# Patient Record
Sex: Female | Born: 1955 | Race: White | Hispanic: No | Marital: Single | Smoking: Former smoker
Health system: Southern US, Community
[De-identification: ages and names within clinical notes are randomized; demographics above are authoritative.]

## PROBLEM LIST (undated history)

## (undated) DIAGNOSIS — N6091 Unspecified benign mammary dysplasia of right breast: Secondary | ICD-10-CM

## (undated) DIAGNOSIS — IMO0002 Reserved for concepts with insufficient information to code with codable children: Secondary | ICD-10-CM

## (undated) DIAGNOSIS — E559 Vitamin D deficiency, unspecified: Secondary | ICD-10-CM

## (undated) DIAGNOSIS — N189 Chronic kidney disease, unspecified: Secondary | ICD-10-CM

## (undated) DIAGNOSIS — C801 Malignant (primary) neoplasm, unspecified: Secondary | ICD-10-CM

## (undated) DIAGNOSIS — E039 Hypothyroidism, unspecified: Secondary | ICD-10-CM

## (undated) DIAGNOSIS — H9190 Unspecified hearing loss, unspecified ear: Secondary | ICD-10-CM

## (undated) DIAGNOSIS — E079 Disorder of thyroid, unspecified: Secondary | ICD-10-CM

## (undated) DIAGNOSIS — K219 Gastro-esophageal reflux disease without esophagitis: Secondary | ICD-10-CM

## (undated) DIAGNOSIS — K2270 Barrett's esophagus without dysplasia: Secondary | ICD-10-CM

## (undated) HISTORY — DX: Barrett's esophagus without dysplasia: K22.70

## (undated) HISTORY — DX: Unspecified hearing loss, unspecified ear: H91.90

## (undated) HISTORY — DX: Chronic kidney disease, unspecified: N18.9

## (undated) HISTORY — PX: CERVICAL FUSION: SHX112

## (undated) HISTORY — DX: Disorder of thyroid, unspecified: E07.9

## (undated) HISTORY — DX: Unspecified benign mammary dysplasia of right breast: N60.91

## (undated) HISTORY — DX: Vitamin D deficiency, unspecified: E55.9

## (undated) HISTORY — DX: Reserved for concepts with insufficient information to code with codable children: IMO0002

---

## 1984-05-14 HISTORY — PX: TONSILLECTOMY: SUR1361

## 2000-07-19 ENCOUNTER — Ambulatory Visit (HOSPITAL_COMMUNITY): Admission: RE | Admit: 2000-07-19 | Discharge: 2000-07-19 | Payer: Self-pay | Admitting: Neurosurgery

## 2000-07-19 ENCOUNTER — Encounter: Payer: Self-pay | Admitting: Neurosurgery

## 2004-06-06 ENCOUNTER — Ambulatory Visit: Payer: Self-pay | Admitting: Obstetrics and Gynecology

## 2005-01-02 ENCOUNTER — Ambulatory Visit: Payer: Self-pay | Admitting: Unknown Physician Specialty

## 2005-05-14 DIAGNOSIS — H9190 Unspecified hearing loss, unspecified ear: Secondary | ICD-10-CM

## 2005-05-14 HISTORY — DX: Unspecified hearing loss, unspecified ear: H91.90

## 2005-07-24 ENCOUNTER — Ambulatory Visit: Payer: Self-pay | Admitting: Unknown Physician Specialty

## 2005-08-01 ENCOUNTER — Ambulatory Visit: Payer: Self-pay | Admitting: Obstetrics and Gynecology

## 2006-08-15 ENCOUNTER — Ambulatory Visit: Payer: Self-pay | Admitting: Obstetrics and Gynecology

## 2006-11-20 ENCOUNTER — Ambulatory Visit: Payer: Self-pay | Admitting: Unknown Physician Specialty

## 2007-08-29 ENCOUNTER — Ambulatory Visit: Payer: Self-pay | Admitting: Obstetrics and Gynecology

## 2009-02-03 ENCOUNTER — Ambulatory Visit: Payer: Self-pay | Admitting: Unknown Physician Specialty

## 2009-04-04 ENCOUNTER — Ambulatory Visit: Payer: Self-pay | Admitting: Internal Medicine

## 2009-06-28 LAB — HEPATIC FUNCTION PANEL
Alkaline Phosphatase: 91 U/L (ref 25–125)
Bilirubin, Total: 0.4 mg/dL

## 2009-06-28 LAB — BASIC METABOLIC PANEL
BUN: 19 mg/dL (ref 4–21)
Sodium: 141 mmol/L (ref 137–147)

## 2009-07-07 ENCOUNTER — Ambulatory Visit: Payer: Self-pay | Admitting: Internal Medicine

## 2010-01-04 LAB — BASIC METABOLIC PANEL: Potassium: 4.2 mmol/L (ref 3.4–5.3)

## 2010-01-04 LAB — HEPATIC FUNCTION PANEL
ALT: 35 U/L (ref 7–35)
AST: 22 U/L (ref 13–35)

## 2010-02-24 ENCOUNTER — Ambulatory Visit: Payer: Self-pay | Admitting: Unknown Physician Specialty

## 2010-07-07 LAB — BASIC METABOLIC PANEL
Glucose: 95 mg/dL
Sodium: 138 mmol/L (ref 137–147)

## 2010-07-07 LAB — HEPATIC FUNCTION PANEL
ALT: 29 U/L (ref 7–35)
AST: 23 U/L (ref 13–35)

## 2010-07-07 LAB — TSH: TSH: 1.64 u[IU]/mL (ref 0.41–5.90)

## 2011-04-11 ENCOUNTER — Encounter: Payer: Self-pay | Admitting: Internal Medicine

## 2011-04-11 ENCOUNTER — Ambulatory Visit (INDEPENDENT_AMBULATORY_CARE_PROVIDER_SITE_OTHER): Payer: Self-pay | Admitting: Internal Medicine

## 2011-04-11 ENCOUNTER — Ambulatory Visit (INDEPENDENT_AMBULATORY_CARE_PROVIDER_SITE_OTHER): Payer: PRIVATE HEALTH INSURANCE | Admitting: Internal Medicine

## 2011-04-11 VITALS — BP 112/70 | HR 91 | Temp 98.2°F | Wt 201.0 lb

## 2011-04-11 DIAGNOSIS — J40 Bronchitis, not specified as acute or chronic: Secondary | ICD-10-CM

## 2011-04-11 DIAGNOSIS — J329 Chronic sinusitis, unspecified: Secondary | ICD-10-CM

## 2011-04-11 DIAGNOSIS — Z0000 Encounter for general adult medical examination without abnormal findings: Secondary | ICD-10-CM

## 2011-04-11 MED ORDER — AZITHROMYCIN 250 MG PO TABS: ORAL_TABLET | ORAL | Status: AC

## 2011-04-11 MED ORDER — PREDNISONE (PAK) 10 MG PO TABS: ORAL_TABLET | ORAL | Status: AC

## 2011-04-11 MED ORDER — ALBUTEROL SULFATE HFA 108 (90 BASE) MCG/ACT IN AERS: 2.0000 | INHALATION_SPRAY | Freq: Four times a day (QID) | RESPIRATORY_TRACT | Status: DC | PRN

## 2011-04-11 MED ORDER — GUAIFENESIN-CODEINE 100-10 MG/5ML PO SYRP: 5.0000 mL | ORAL_SOLUTION | Freq: Two times a day (BID) | ORAL | Status: DC | PRN

## 2011-04-11 NOTE — Progress Notes (Signed)
Subjective:    Patient ID: Elizabeth Rice, female    DOB: 11/17/1955, 55 y.o.   MRN: [id number]  HPI 55 year old female with a history of hypothyroidism presents for an acute visit complaining of four-day history of productive cough, shortness of breath, nasal congestion, bilateral ear pain, and general malaise. She notes that her daughter was recently sick with similar symptoms. She notes fever and chills over the last 4 days. She has been taking over-the-counter cough and cold medications including Alka-Seltzer and Mucinex with no improvement.  Outpatient Encounter Prescriptions as of 04/11/2011  Medication Sig Dispense Refill  . albuterol (VENTOLIN HFA) 108 (90 BASE) MCG/ACT inhaler Inhale 2 puffs into the lungs every 6 (six) hours as needed.  18 g  6  . Cholecalciferol (VITAMIN D) 2000 UNITS CAPS Take 1 capsule by mouth daily.        . Fluticasone-Salmeterol (ADVAIR DISKUS) 250-50 MCG/DOSE AEPB Inhale 1 puff into the lungs every 12 (twelve) hours.        Marland Kitchen levothyroxine (SYNTHROID, LEVOTHROID) 50 MCG tablet Take 50 mcg by mouth daily.        . naproxen sodium (ANAPROX) 220 MG tablet Take 220 mg by mouth as needed.        Marland Kitchen omeprazole (PRILOSEC) 20 MG capsule Take 20 mg by mouth as directed. One in the AM and Two in the PM         Review of Systems  Constitutional: Negative for fever, chills and unexpected weight change.  HENT: Positive for ear pain, congestion, rhinorrhea, postnasal drip and sinus pressure. Negative for hearing loss, nosebleeds, sore throat, facial swelling, sneezing, mouth sores, trouble swallowing, neck pain, neck stiffness, voice change, tinnitus and ear discharge.   Eyes: Negative for pain, discharge, redness and visual disturbance.  Respiratory: Positive for cough, chest tightness, shortness of breath and wheezing. Negative for stridor.   Cardiovascular: Negative for chest pain, palpitations and leg swelling.  Musculoskeletal: Negative for myalgias and arthralgias.    Skin: Negative for color change and rash.  Neurological: Negative for dizziness, weakness, light-headedness and headaches.  Hematological: Negative for adenopathy.   BP 112/70  Pulse 91  Temp(Src) 98.2 F (36.8 C) (Oral)  Wt 201 lb (91.173 kg)  SpO2 97%     Objective:   Physical Exam  Constitutional: She is oriented to person, place, and time. She appears well-developed and well-nourished. No distress.  HENT:  Head: Normocephalic and atraumatic.  Right Ear: External ear normal. Tympanic membrane is erythematous. A middle ear effusion is present.  Left Ear: External ear normal. Tympanic membrane is erythematous. A middle ear effusion is present.  Nose: Nose normal.  Mouth/Throat: Posterior oropharyngeal erythema present. No oropharyngeal exudate.  Eyes: Conjunctivae are normal. Pupils are equal, round, and reactive to light. Right eye exhibits no discharge. Left eye exhibits no discharge. No scleral icterus.  Neck: Normal range of motion. Neck supple. No tracheal deviation present. No thyromegaly present.  Cardiovascular: Normal rate, regular rhythm, normal heart sounds and intact distal pulses.  Exam reveals no gallop and no friction rub.   No murmur heard. Pulmonary/Chest: Effort normal. Not tachypneic. No respiratory distress. She has wheezes. She has rhonchi. She has no rales. She exhibits no tenderness.  Musculoskeletal: Normal range of motion. She exhibits no edema and no tenderness.  Lymphadenopathy:    She has no cervical adenopathy.  Neurological: She is alert and oriented to person, place, and time. No cranial nerve deficit. She exhibits normal muscle tone. Coordination normal.  Skin: Skin is warm and dry. No rash noted. She is not diaphoretic. No erythema. No pallor.  Psychiatric: She has a normal mood and affect. Her behavior is normal. Judgment and thought content normal.          Assessment & Plan:  1. Bronchitis - symptoms and exam are consistent with  bronchitis. Will treat with azithromycin and prednisone. She will use codeine as needed for cough, and albuterol for dyspnea. She will call if symptoms are not improving over the next 48 hours.

## 2011-04-12 NOTE — Progress Notes (Signed)
  Subjective:    Patient ID: Elizabeth Rice, female    DOB: Sep 24, 1955, 55 y.o.   MRN: [id number]  HPI Entered in error, wrong pt   Review of Systems     Objective:   Physical Exam        Assessment & Plan:

## 2011-07-02 ENCOUNTER — Other Ambulatory Visit: Payer: Self-pay | Admitting: *Deleted

## 2011-07-02 MED ORDER — FLUTICASONE-SALMETEROL 250-50 MCG/DOSE IN AEPB: 1.0000 | INHALATION_SPRAY | Freq: Two times a day (BID) | RESPIRATORY_TRACT | Status: DC

## 2011-07-04 ENCOUNTER — Other Ambulatory Visit (INDEPENDENT_AMBULATORY_CARE_PROVIDER_SITE_OTHER): Payer: PRIVATE HEALTH INSURANCE | Admitting: *Deleted

## 2011-07-04 DIAGNOSIS — Z0000 Encounter for general adult medical examination without abnormal findings: Secondary | ICD-10-CM

## 2011-07-04 LAB — CBC WITH DIFFERENTIAL/PLATELET
Basophils Absolute: 0.1 10*3/uL (ref 0.0–0.1)
Eosinophils Absolute: 0.2 10*3/uL (ref 0.0–0.7)
Eosinophils Relative: 2.1 % (ref 0.0–5.0)
HCT: 42.4 % (ref 36.0–46.0)
Lymphs Abs: 2.9 10*3/uL (ref 0.7–4.0)
MCHC: 33.8 g/dL (ref 30.0–36.0)
MCV: 88.7 fl (ref 78.0–100.0)
Monocytes Absolute: 0.7 10*3/uL (ref 0.1–1.0)
Platelets: 271 10*3/uL (ref 150.0–400.0)
RDW: 13.6 % (ref 11.5–14.6)

## 2011-07-04 LAB — COMPREHENSIVE METABOLIC PANEL
AST: 22 U/L (ref 0–37)
Alkaline Phosphatase: 77 U/L (ref 39–117)
Glucose, Bld: 94 mg/dL (ref 70–99)
Sodium: 140 mEq/L (ref 135–145)
Total Bilirubin: 0.5 mg/dL (ref 0.3–1.2)
Total Protein: 6.8 g/dL (ref 6.0–8.3)

## 2011-07-04 LAB — LIPID PANEL
Cholesterol: 195 mg/dL (ref 0–200)
LDL Cholesterol: 102 mg/dL — ABNORMAL HIGH (ref 0–99)
VLDL: 16.6 mg/dL (ref 0.0–40.0)

## 2011-07-04 LAB — MICROALBUMIN / CREATININE URINE RATIO: Creatinine,U: 109.8 mg/dL

## 2011-07-05 ENCOUNTER — Other Ambulatory Visit: Payer: PRIVATE HEALTH INSURANCE

## 2011-07-12 ENCOUNTER — Encounter: Payer: Self-pay | Admitting: Internal Medicine

## 2011-07-12 ENCOUNTER — Ambulatory Visit (INDEPENDENT_AMBULATORY_CARE_PROVIDER_SITE_OTHER): Payer: PRIVATE HEALTH INSURANCE | Admitting: Internal Medicine

## 2011-07-12 VITALS — BP 120/72 | HR 89 | Temp 98.2°F | Ht 66.5 in | Wt 206.0 lb

## 2011-07-12 DIAGNOSIS — Z0000 Encounter for general adult medical examination without abnormal findings: Secondary | ICD-10-CM | POA: Insufficient documentation

## 2011-07-12 DIAGNOSIS — E039 Hypothyroidism, unspecified: Secondary | ICD-10-CM

## 2011-07-12 DIAGNOSIS — J45909 Unspecified asthma, uncomplicated: Secondary | ICD-10-CM | POA: Insufficient documentation

## 2011-07-12 DIAGNOSIS — N182 Chronic kidney disease, stage 2 (mild): Secondary | ICD-10-CM | POA: Insufficient documentation

## 2011-07-12 DIAGNOSIS — Z1239 Encounter for other screening for malignant neoplasm of breast: Secondary | ICD-10-CM

## 2011-07-12 DIAGNOSIS — Z23 Encounter for immunization: Secondary | ICD-10-CM

## 2011-07-12 MED ORDER — ZOSTER VACCINE LIVE 19400 UNT/0.65ML ~~LOC~~ SOLR: 0.6500 mL | Freq: Once | SUBCUTANEOUS | Status: AC

## 2011-07-12 NOTE — Patient Instructions (Signed)
Dr. Burman Riis - [facility]

## 2011-07-12 NOTE — Assessment & Plan Note (Signed)
Breast exam normal today. Mammogram ordered. 

## 2011-07-12 NOTE — Progress Notes (Signed)
Subjective:    Patient ID: Elizabeth Rice, female    DOB: 12/17/1955, 56 y.o.   MRN: [id number]  HPI 56 year old female with history of hearing loss, hypothyroidism, and asthma presents for her annual exam. She reports that generally she is doing well. She has no new complaints today. In regards to her hypothyroidism, she reports full compliance with her medication. She denies symptoms such as fatigue or temperature intolerance. In regards to history of asthma, she reports symptoms are very well controlled with use of Advair and albuterol as needed. She denies any recent asthma exacerbations. In regards to hearing loss, she notes some improvement with use of hearing aid in her right ear.  She notes that she is planning to make an effort to lose weight. She is planning to try to improve her dietary choices with increased intake of fiber. She is also planning to increase her physical activity by using her home elliptical machine and lifting weights.  Outpatient Encounter Prescriptions as of 07/12/2011  Medication Sig Dispense Refill  . albuterol (VENTOLIN HFA) 108 (90 BASE) MCG/ACT inhaler Inhale 2 puffs into the lungs every 6 (six) hours as needed.  18 g  6  . cholecalciferol (VITAMIN D) 1000 UNITS tablet Take 2,000 Units by mouth daily.        . Fluticasone-Salmeterol (ADVAIR DISKUS) 250-50 MCG/DOSE AEPB Inhale 1 puff into the lungs every 12 (twelve) hours.  180 each  1  . levothyroxine (SYNTHROID, LEVOTHROID) 50 MCG tablet Take 50 mcg by mouth daily.        . naproxen sodium (ANAPROX) 220 MG tablet Take 220 mg by mouth as needed.        Marland Kitchen omeprazole (PRILOSEC) 20 MG capsule Take 20 mg by mouth as directed. One in the AM and Two in the PM      . zoster vaccine live, PF, (ZOSTAVAX) 40981 UNT/0.65ML injection Inject 19,400 Units into the skin once.  1 each  0    Review of Systems  Constitutional: Negative for fever, chills, appetite change, fatigue and unexpected weight change.  HENT: Positive for  hearing loss. Negative for ear pain, congestion, sore throat, trouble swallowing, neck pain, voice change and sinus pressure.   Eyes: Negative for visual disturbance.  Respiratory: Negative for cough, shortness of breath, wheezing and stridor.   Cardiovascular: Negative for chest pain, palpitations and leg swelling.  Gastrointestinal: Negative for nausea, vomiting, abdominal pain, diarrhea, constipation, blood in stool, abdominal distention and anal bleeding.  Genitourinary: Negative for dysuria and flank pain.  Musculoskeletal: Negative for myalgias, arthralgias and gait problem.  Skin: Negative for color change and rash.  Neurological: Negative for dizziness and headaches.  Hematological: Negative for adenopathy. Does not bruise/bleed easily.  Psychiatric/Behavioral: Negative for suicidal ideas, sleep disturbance and dysphoric mood. The patient is not nervous/anxious.    BP 120/72  Pulse 89  Temp(Src) 98.2 F (36.8 C) (Oral)  Ht 5' 6.5" (1.689 m)  Wt 206 lb (93.441 kg)  BMI 32.75 kg/m2  SpO2 96%     Objective:   Physical Exam  Constitutional: She is oriented to person, place, and time. She appears well-developed and well-nourished. No distress.  HENT:  Head: Normocephalic and atraumatic.  Right Ear: External ear normal.  Left Ear: External ear normal.  Nose: Nose normal.  Mouth/Throat: Oropharynx is clear and moist. No oropharyngeal exudate.  Eyes: Conjunctivae are normal. Pupils are equal, round, and reactive to light. Right eye exhibits no discharge. Left eye exhibits no discharge. No  scleral icterus.  Neck: Normal range of motion. Neck supple. No tracheal deviation present. No thyromegaly present.  Cardiovascular: Normal rate, regular rhythm, normal heart sounds and intact distal pulses.  Exam reveals no gallop and no friction rub.   No murmur heard. Pulmonary/Chest: Effort normal and breath sounds normal. No respiratory distress. She has no wheezes. She has no rales. She  exhibits no tenderness. Right breast exhibits no inverted nipple, no mass, no nipple discharge, no skin change and no tenderness. Left breast exhibits no inverted nipple, no mass, no nipple discharge, no skin change and no tenderness. Breasts are symmetrical.  Abdominal: Soft. Bowel sounds are normal. She exhibits no distension and no mass. There is no tenderness. There is no rebound and no guarding.  Musculoskeletal: Normal range of motion. She exhibits no edema and no tenderness.  Lymphadenopathy:    She has no cervical adenopathy.  Neurological: She is alert and oriented to person, place, and time. No cranial nerve deficit. She exhibits normal muscle tone. Coordination normal.  Skin: Skin is warm and dry. No rash noted. She is not diaphoretic. No erythema. No pallor.  Psychiatric: She has a normal mood and affect. Her behavior is normal. Judgment and thought content normal.          Assessment & Plan:

## 2011-07-12 NOTE — Assessment & Plan Note (Signed)
Symptomatically well-controlled with Advair and albuterol as needed. We'll continue these medications. Followup 6 months.

## 2011-07-12 NOTE — Assessment & Plan Note (Signed)
TSH was not checked with labs. We'll need to check TSH with labs in 6 months. Continue Synthroid at current dose.

## 2011-07-12 NOTE — Assessment & Plan Note (Signed)
Exam including breast exam is normal today. Will order mammogram. Patient is up-to-date on Pap smear and reports it was normal last year. Will obtain records on this. Reviewed recent lab work including CBC, CMP, and lipid profile. Encouraged efforts at improvement dietary with reduced intake of saturated fat and high sugar foods and increase intake of fiber. Encouraged increased physical activity. Goal exercise 30 minutes of aerobic exercise most days of the week. Patient will followup in 6 months.

## 2011-07-12 NOTE — Assessment & Plan Note (Signed)
The patient has had elevated creatinine in the past. Recent labs show creatinine of 1.2. Need records from her previous office to review previous values.

## 2011-07-23 ENCOUNTER — Encounter: Payer: Self-pay | Admitting: Internal Medicine

## 2011-07-26 ENCOUNTER — Other Ambulatory Visit: Payer: Self-pay | Admitting: *Deleted

## 2011-07-26 MED ORDER — LEVOTHYROXINE SODIUM 50 MCG PO TABS: 50.0000 ug | ORAL_TABLET | Freq: Every day | ORAL | Status: DC

## 2011-08-28 ENCOUNTER — Other Ambulatory Visit: Payer: Self-pay | Admitting: *Deleted

## 2011-08-28 ENCOUNTER — Telehealth: Payer: Self-pay | Admitting: *Deleted

## 2011-08-28 MED ORDER — LEVOTHYROXINE SODIUM 50 MCG PO TABS: 50.0000 ug | ORAL_TABLET | Freq: Every day | ORAL | Status: DC

## 2011-08-28 NOTE — Telephone Encounter (Signed)
Pt left VM-  1. Rx for synthroid was not received at pharm ([facility]). Our records show RX was sent in to pharm in March. I informed pt and resent RX 2. Pt wanted update on mammogram which was ordered at last OV. I printed, MD signed and E[facility] Noe will schedule. Pt aware that they will call when it is set up at [facility].

## 2011-09-24 ENCOUNTER — Ambulatory Visit: Payer: Self-pay | Admitting: Internal Medicine

## 2011-09-28 ENCOUNTER — Encounter: Payer: Self-pay | Admitting: Internal Medicine

## 2011-12-02 ENCOUNTER — Emergency Department: Payer: Self-pay | Admitting: Emergency Medicine

## 2011-12-03 ENCOUNTER — Telehealth: Payer: Self-pay | Admitting: Internal Medicine

## 2011-12-03 DIAGNOSIS — M545 Low back pain: Secondary | ICD-10-CM

## 2011-12-03 NOTE — Telephone Encounter (Signed)
Leave message on home phone number Pt stated she spent the day at er [facility]  (dr Daphine Deutscher) 7/21//13 she wanted to know if they called and spoke with you about her. Please advise  Made appointment for 7/30 @ 8:30 with dr walker

## 2011-12-03 NOTE — Telephone Encounter (Signed)
Requested ER notes from [facility] today at 4:12 pm.

## 2011-12-04 NOTE — Telephone Encounter (Signed)
I do not have th ER notes. All 3 folders checked.

## 2011-12-04 NOTE — Telephone Encounter (Signed)
Dr. Darrick Huntsman reviewed ER notes and wants patient to have MRI of lumbar spine w/o contrast and keep follow up appt with Dr. Dan Humphreys on 12/11/2011.  Left message on machine at home for patient to return call.

## 2011-12-04 NOTE — Telephone Encounter (Signed)
ER notes given to Dr. Darrick Huntsman for review.  Is it ok for patient to wait and be seen on 07/30?

## 2011-12-05 NOTE — Telephone Encounter (Signed)
Pt returned your call.  

## 2011-12-05 NOTE — Telephone Encounter (Signed)
Pt called back wanting to speak to a nurse please call pt

## 2011-12-06 NOTE — Telephone Encounter (Signed)
Left message on machine at home for patient to return call. 

## 2011-12-07 NOTE — Telephone Encounter (Signed)
Left message on cell phone voicemail for patient to return call. 

## 2011-12-07 NOTE — Telephone Encounter (Signed)
Patient advised via telephone, she stated that her MRI is scheduled for 12/10/2011.

## 2011-12-10 ENCOUNTER — Ambulatory Visit: Payer: Self-pay | Admitting: Internal Medicine

## 2011-12-11 ENCOUNTER — Encounter: Payer: Self-pay | Admitting: Internal Medicine

## 2011-12-11 ENCOUNTER — Ambulatory Visit (INDEPENDENT_AMBULATORY_CARE_PROVIDER_SITE_OTHER): Payer: PRIVATE HEALTH INSURANCE | Admitting: Internal Medicine

## 2011-12-11 VITALS — BP 140/90 | HR 60 | Temp 98.2°F | Ht 66.5 in | Wt 206.5 lb

## 2011-12-11 DIAGNOSIS — M5417 Radiculopathy, lumbosacral region: Secondary | ICD-10-CM

## 2011-12-11 DIAGNOSIS — IMO0002 Reserved for concepts with insufficient information to code with codable children: Secondary | ICD-10-CM

## 2011-12-11 NOTE — Progress Notes (Signed)
Subjective:    Patient ID: Elizabeth Rice, female    DOB: Feb 28, 1956, 56 y.o.   MRN: [id number]  HPI 56 year old female presents for acute visit after recent ER evaluation for severe lower back pain. She reports that lower back pain began suddenly over the weekend. It was so severe that she was unable to walk. She reports that she had severe lower back pain radiating down her left leg with some numbness in her left foot. She was transported to the ER by EMS. She reports that her pain improved with the use of Decadron and narcotic pain medications. Over the last few days she has tapered down on Decadron. She continues to have some pain radiating down her left posterior leg and a bandlike sensation around her left superior thigh. She has limited her physical activities significantly because of pain. She does not have incontinence of bowel or bladder. 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  Outpatient Encounter Prescriptions as of 12/11/2011  Medication Sig Dispense Refill  . albuterol (VENTOLIN HFA) 108 (90 BASE) MCG/ACT inhaler Inhale 2 puffs into the lungs every 6 (six) hours as needed.  18 g  6  . cholecalciferol (VITAMIN D) 1000 UNITS tablet Take 2,000 Units by mouth daily.        . Fluticasone-Salmeterol (ADVAIR DISKUS) 250-50 MCG/DOSE AEPB Inhale 1 puff into the lungs every 12 (twelve) hours.  180 each  1  . levothyroxine (SYNTHROID, LEVOTHROID) 50 MCG tablet Take 1 tablet (50 mcg total) by mouth daily.  90 tablet  1  . naproxen sodium (ANAPROX) 220 MG tablet Take 220 mg by mouth as needed.        Marland Kitchen omeprazole (PRILOSEC) 20 MG capsule Take 20 mg by mouth as directed. One in the AM and Two in the PM        Review of Systems  Constitutional: Negative for fever, chills, appetite change, fatigue and unexpected weight change.  HENT: Negative for neck pain.   Eyes: Negative for visual disturbance.  Respiratory:  Negative for cough, shortness of breath, wheezing and stridor.   Cardiovascular: Negative for chest pain, palpitations and leg swelling.  Gastrointestinal: Negative for abdominal pain and rectal pain.  Genitourinary: Negative for dysuria and flank pain.  Musculoskeletal: Positive for myalgias, back pain, arthralgias and gait problem.  Skin: Negative for color change and rash.  Neurological: Positive for weakness and numbness. Negative for dizziness and headaches.  Hematological: Negative for adenopathy. Does not bruise/bleed easily.  Psychiatric/Behavioral: Negative for suicidal ideas, disturbed wake/sleep cycle and dysphoric mood. The patient is not nervous/anxious.    BP 140/90  Pulse 60  Temp 98.2 F (36.8 C) (Oral)  Ht 5' 6.5" (1.689 m)  Wt 206 lb 8 oz (93.668 kg)  BMI 32.83 kg/m2  SpO2 99%     Objective:   Physical Exam  Constitutional: She is oriented to person, place, and time. She appears well-developed and well-nourished. No distress.  HENT:  Head: Normocephalic.  Neck: Normal range of motion.  Pulmonary/Chest: Effort normal.  Musculoskeletal: Normal range of motion. She exhibits tenderness.       Lumbar back: She exhibits pain.  Neurological: She is alert and oriented to person, place, and time. She has normal reflexes. No cranial nerve deficit. She exhibits normal muscle tone. Coordination normal.  Skin: Skin is warm and dry. No rash noted. She is not diaphoretic. No erythema. No pallor.  Psychiatric: She has a normal mood and affect. Her behavior is normal. Judgment and thought content normal.          Assessment & Plan:

## 2011-12-11 NOTE — Assessment & Plan Note (Addendum)
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 Will set up evaluation by neurosurgery. Will continue anti-inflammatories including Aleve daily. If symptoms worsen prior to neurosurgery visit, will restart prednisone taper.

## 2011-12-17 ENCOUNTER — Encounter: Payer: Self-pay | Admitting: Internal Medicine

## 2012-01-09 ENCOUNTER — Encounter: Payer: Self-pay | Admitting: Internal Medicine

## 2012-01-09 ENCOUNTER — Ambulatory Visit (INDEPENDENT_AMBULATORY_CARE_PROVIDER_SITE_OTHER): Payer: PRIVATE HEALTH INSURANCE | Admitting: Internal Medicine

## 2012-01-09 VITALS — BP 110/80 | HR 66 | Temp 98.6°F | Ht 66.5 in | Wt 207.5 lb

## 2012-01-09 DIAGNOSIS — I10 Essential (primary) hypertension: Secondary | ICD-10-CM

## 2012-01-09 DIAGNOSIS — N182 Chronic kidney disease, stage 2 (mild): Secondary | ICD-10-CM

## 2012-01-09 DIAGNOSIS — IMO0002 Reserved for concepts with insufficient information to code with codable children: Secondary | ICD-10-CM

## 2012-01-09 DIAGNOSIS — E039 Hypothyroidism, unspecified: Secondary | ICD-10-CM

## 2012-01-09 DIAGNOSIS — M5417 Radiculopathy, lumbosacral region: Secondary | ICD-10-CM

## 2012-01-09 LAB — COMPREHENSIVE METABOLIC PANEL
AST: 24 U/L (ref 0–37)
BUN: 20 mg/dL (ref 6–23)
Calcium: 9 mg/dL (ref 8.4–10.5)
Chloride: 103 mEq/L (ref 96–112)
Creatinine, Ser: 1.1 mg/dL (ref 0.4–1.2)
Total Bilirubin: 0.6 mg/dL (ref 0.3–1.2)

## 2012-01-09 LAB — MICROALBUMIN / CREATININE URINE RATIO: Microalb, Ur: 0.8 mg/dL (ref 0.0–1.9)

## 2012-01-09 NOTE — Progress Notes (Signed)
Subjective:    Patient ID: Elizabeth Rice, female    DOB: 1955-09-18, 56 y.o.   MRN: [id number]  HPI 56 year old female with history of hypothyroidism, chronic kidney disease, and recent episode of lumbar radiculopathy presents for followup. She reports she is generally doing well. She notes that she was seen by neurosurgery and they recommended conservative management of radiculopathy. She has started in physical therapy program. She reports that symptoms are better and she has not had any severe episodes as previously.  In regards to other chronic medical issues including hypothyroidism and chronic kidney disease, she reports compliance with her medications. She denies any concerns today. She reports normal energy level. No recent changes in appetite or bowel habits.  Outpatient Encounter Prescriptions as of 01/09/2012  Medication Sig Dispense Refill  . albuterol (VENTOLIN HFA) 108 (90 BASE) MCG/ACT inhaler Inhale 2 puffs into the lungs every 6 (six) hours as needed.  18 g  6  . cholecalciferol (VITAMIN D) 1000 UNITS tablet Take 2,000 Units by mouth daily.        . Fluticasone-Salmeterol (ADVAIR DISKUS) 250-50 MCG/DOSE AEPB Inhale 1 puff into the lungs every 12 (twelve) hours.  180 each  1  . levothyroxine (SYNTHROID, LEVOTHROID) 50 MCG tablet Take 1 tablet (50 mcg total) by mouth daily.  90 tablet  1  . naproxen sodium (ANAPROX) 220 MG tablet Take 220 mg by mouth as needed.        Marland Kitchen omeprazole (PRILOSEC) 20 MG capsule Take two tablets in the morning and one tablet at night       BP 110/80  Pulse 66  Temp 98.6 F (37 C) (Oral)  Ht 5' 6.5" (1.689 m)  Wt 207 lb 8 oz (94.121 kg)  BMI 32.99 kg/m2  SpO2 97%  Review of Systems  Constitutional: Negative for fever, chills, appetite change, fatigue and unexpected weight change.  HENT: Negative for ear pain, congestion, sore throat, trouble swallowing, neck pain, voice change and sinus pressure.   Eyes: Negative for visual disturbance.    Respiratory: Negative for cough, shortness of breath, wheezing and stridor.   Cardiovascular: Negative for chest pain, palpitations and leg swelling.  Gastrointestinal: Negative for nausea, vomiting, abdominal pain, diarrhea, constipation, blood in stool, abdominal distention and anal bleeding.  Genitourinary: Negative for dysuria and flank pain.  Musculoskeletal: Negative for myalgias, arthralgias and gait problem.  Skin: Negative for color change and rash.  Neurological: Negative for dizziness and headaches.  Hematological: Negative for adenopathy. Does not bruise/bleed easily.  Psychiatric/Behavioral: Negative for suicidal ideas, disturbed wake/sleep cycle and dysphoric mood. The patient is not nervous/anxious.        Objective:   Physical Exam  Constitutional: She is oriented to person, place, and time. She appears well-developed and well-nourished. No distress.  HENT:  Head: Normocephalic and atraumatic.  Right Ear: External ear normal.  Left Ear: External ear normal.  Nose: Nose normal.  Mouth/Throat: Oropharynx is clear and moist. No oropharyngeal exudate.  Eyes: Conjunctivae are normal. Pupils are equal, round, and reactive to light. Right eye exhibits no discharge. Left eye exhibits no discharge. No scleral icterus.  Neck: Normal range of motion. Neck supple. No tracheal deviation present. No thyromegaly present.  Cardiovascular: Normal rate, regular rhythm, normal heart sounds and intact distal pulses.  Exam reveals no gallop and no friction rub.   No murmur heard. Pulmonary/Chest: Effort normal and breath sounds normal. No respiratory distress. She has no wheezes. She has no rales. She exhibits no tenderness.  Musculoskeletal: Normal range of motion. She exhibits no edema and no tenderness.  Lymphadenopathy:    She has no cervical adenopathy.  Neurological: She is alert and oriented to person, place, and time. No cranial nerve deficit. She exhibits normal muscle tone.  Coordination normal.  Skin: Skin is warm and dry. No rash noted. She is not diaphoretic. No erythema. No pallor.  Psychiatric: She has a normal mood and affect. Her behavior is normal. Judgment and thought content normal.          Assessment & Plan:

## 2012-01-09 NOTE — Assessment & Plan Note (Signed)
Symptoms much improved compared to previous visit. Patient was evaluated by neurosurgery and reports that they have recommended conservative treatment with physical therapy. Will request notes on evaluation. Will continue to monitor.

## 2012-01-09 NOTE — Assessment & Plan Note (Signed)
Will check renal function and urine microalbumin with labs today. Follow up 6 months.

## 2012-01-09 NOTE — Assessment & Plan Note (Signed)
Will check TSH with labs today. Continue Synthroid. Follow up 6 months.

## 2012-05-20 ENCOUNTER — Encounter: Payer: Self-pay | Admitting: Internal Medicine

## 2012-05-20 ENCOUNTER — Ambulatory Visit (INDEPENDENT_AMBULATORY_CARE_PROVIDER_SITE_OTHER): Payer: PRIVATE HEALTH INSURANCE | Admitting: Internal Medicine

## 2012-05-20 VITALS — BP 112/80 | HR 65 | Temp 97.9°F | Ht 66.5 in | Wt 202.8 lb

## 2012-05-20 DIAGNOSIS — J45901 Unspecified asthma with (acute) exacerbation: Secondary | ICD-10-CM

## 2012-05-20 DIAGNOSIS — J40 Bronchitis, not specified as acute or chronic: Secondary | ICD-10-CM

## 2012-05-20 DIAGNOSIS — J45909 Unspecified asthma, uncomplicated: Secondary | ICD-10-CM | POA: Insufficient documentation

## 2012-05-20 MED ORDER — PREDNISONE (PAK) 10 MG PO TABS: ORAL_TABLET | ORAL | Status: DC

## 2012-05-20 MED ORDER — LEVOFLOXACIN 500 MG PO TABS: 500.0000 mg | ORAL_TABLET | Freq: Every day | ORAL | Status: DC

## 2012-05-20 MED ORDER — ALBUTEROL SULFATE HFA 108 (90 BASE) MCG/ACT IN AERS: 2.0000 | INHALATION_SPRAY | Freq: Four times a day (QID) | RESPIRATORY_TRACT | Status: DC | PRN

## 2012-05-20 MED ORDER — FLUTICASONE-SALMETEROL 250-50 MCG/DOSE IN AEPB: 1.0000 | INHALATION_SPRAY | Freq: Two times a day (BID) | RESPIRATORY_TRACT | Status: DC

## 2012-05-20 NOTE — Assessment & Plan Note (Signed)
Symptoms and exam are consistent with asthma with acute bronchitis. Will treat with prednisone taper and Levaquin. Patient will continue Advair and albuterol as needed. She will call if symptoms are not improving over the next 24-48 hours. Followup one week.

## 2012-05-20 NOTE — Progress Notes (Signed)
Subjective:    Patient ID: Elizabeth Rice, female    DOB: Jan 20, 1956, 57 y.o.   MRN: [id number]  HPI 57 year old female with history of asthma presents for acute visit complaining of 2 week history of cough and shortness of breath. She reports that symptoms first began approximately 2 weeks ago with general malaise, sore throat, nasal drainage, and productive cough. She also had subjective fever and chills. Cold symptoms began to resolve however she developed worsening shortness of breath, wheezing, and persistent dry cough. She has been taking her albuterol every couple of hours with no improvement and shortness of breath and wheezing. She has not had any recurrent fever. She denies any chest pain.  Outpatient Encounter Prescriptions as of 05/20/2012  Medication Sig Dispense Refill  . albuterol (VENTOLIN HFA) 108 (90 BASE) MCG/ACT inhaler Inhale 2 puffs into the lungs every 6 (six) hours as needed.  18 g  6  . cholecalciferol (VITAMIN D) 1000 UNITS tablet Take 2,000 Units by mouth daily.        . Fluticasone-Salmeterol (ADVAIR DISKUS) 250-50 MCG/DOSE AEPB Inhale 1 puff into the lungs every 12 (twelve) hours.  180 each  1  . levothyroxine (SYNTHROID, LEVOTHROID) 50 MCG tablet Take 1 tablet (50 mcg total) by mouth daily.  90 tablet  1  . omeprazole (PRILOSEC) 20 MG capsule Take two tablets in the morning and one tablet at night      . [DISCONTINUED] albuterol (VENTOLIN HFA) 108 (90 BASE) MCG/ACT inhaler Inhale 2 puffs into the lungs every 6 (six) hours as needed.  18 g  6  . [DISCONTINUED] Fluticasone-Salmeterol (ADVAIR DISKUS) 250-50 MCG/DOSE AEPB Inhale 1 puff into the lungs every 12 (twelve) hours.  180 each  1  . levofloxacin (LEVAQUIN) 500 MG tablet Take 1 tablet (500 mg total) by mouth daily.  7 tablet  0  . naproxen sodium (ANAPROX) 220 MG tablet Take 220 mg by mouth as needed.        . predniSONE (STERAPRED UNI-PAK) 10 MG tablet Take 60mg  day 1 then taper by 10mg  daily  21 tablet  0   BP  112/80  Pulse 65  Temp 97.9 F (36.6 C) (Oral)  Ht 5' 6.5" (1.689 m)  Wt 202 lb 12 oz (91.967 kg)  BMI 32.23 kg/m2  SpO2 97%  Review of Systems  Constitutional: Positive for fever and chills. Negative for unexpected weight change.  HENT: Positive for sore throat, rhinorrhea and postnasal drip. Negative for hearing loss, ear pain, nosebleeds, congestion, facial swelling, sneezing, mouth sores, trouble swallowing, neck pain, neck stiffness, voice change, sinus pressure, tinnitus and ear discharge.   Eyes: Negative for pain, discharge, redness and visual disturbance.  Respiratory: Positive for cough, shortness of breath and wheezing. Negative for chest tightness and stridor.   Cardiovascular: Negative for chest pain, palpitations and leg swelling.  Musculoskeletal: Negative for myalgias and arthralgias.  Skin: Negative for color change and rash.  Neurological: Negative for dizziness, weakness, light-headedness and headaches.  Hematological: Negative for adenopathy.       Objective:   Physical Exam  Constitutional: She is oriented to person, place, and time. She appears well-developed and well-nourished. No distress.  HENT:  Head: Normocephalic and atraumatic.  Right Ear: External ear normal.  Left Ear: External ear normal.  Nose: Nose normal.  Mouth/Throat: Oropharynx is clear and moist. No oropharyngeal exudate.  Eyes: Conjunctivae normal are normal. Pupils are equal, round, and reactive to light. Right eye exhibits no discharge. Left eye exhibits  no discharge. No scleral icterus.  Neck: Normal range of motion. Neck supple. No tracheal deviation present. No thyromegaly present.  Cardiovascular: Normal rate, regular rhythm, normal heart sounds and intact distal pulses.  Exam reveals no gallop and no friction rub.   No murmur heard. Pulmonary/Chest: Accessory muscle usage (with minimal exertion) present. Not tachypneic. No respiratory distress. She has decreased breath sounds. She has  wheezes. She has rhonchi. She has no rales. She exhibits no tenderness.  Musculoskeletal: Normal range of motion. She exhibits no edema and no tenderness.  Lymphadenopathy:    She has no cervical adenopathy.  Neurological: She is alert and oriented to person, place, and time. No cranial nerve deficit. She exhibits normal muscle tone. Coordination normal.  Skin: Skin is warm and dry. No rash noted. She is not diaphoretic. No erythema. No pallor.  Psychiatric: She has a normal mood and affect. Her behavior is normal. Judgment and thought content normal.          Assessment & Plan:

## 2012-05-26 ENCOUNTER — Encounter: Payer: Self-pay | Admitting: Internal Medicine

## 2012-05-26 ENCOUNTER — Ambulatory Visit (INDEPENDENT_AMBULATORY_CARE_PROVIDER_SITE_OTHER): Payer: Self-pay | Admitting: Internal Medicine

## 2012-05-26 ENCOUNTER — Other Ambulatory Visit: Payer: Self-pay | Admitting: Internal Medicine

## 2012-05-26 VITALS — BP 119/74 | HR 73 | Temp 97.8°F | Ht 66.5 in | Wt 200.2 lb

## 2012-05-26 DIAGNOSIS — J209 Acute bronchitis, unspecified: Secondary | ICD-10-CM

## 2012-05-26 DIAGNOSIS — J45901 Unspecified asthma with (acute) exacerbation: Secondary | ICD-10-CM

## 2012-05-26 MED ORDER — LEVOTHYROXINE SODIUM 50 MCG PO TABS: 50.0000 ug | ORAL_TABLET | Freq: Every day | ORAL | Status: DC

## 2012-05-26 NOTE — Telephone Encounter (Signed)
[facility] [address] [address]   levothyroxine (SYNTHROID, LEVOTHROID) 50 MCG tablet  #90

## 2012-05-26 NOTE — Assessment & Plan Note (Signed)
Symptoms improved, but still having some chest tightness and wheezing. Exam remarkable for few rhonchi left lower lobe. If any worsening dyspnea, fever, chest pain, pt will RTC and have CXR for evaluation. Otherwise, continue inhaled bronchodilators and supportive care.

## 2012-05-26 NOTE — Progress Notes (Signed)
Subjective:    Patient ID: Elizabeth Rice, female    DOB: 1955-11-09, 57 y.o.   MRN: [id number]  HPI 57YO female with asthma and recent bronchitis presents for follow up. Dyspnea and wheezing have improved. However, still having some chest tightness. Using albuterol with improvement. No fever, chills, chest pain.Completed prednisone and Levaquin.  Outpatient Encounter Prescriptions as of 05/26/2012  Medication Sig Dispense Refill  . albuterol (VENTOLIN HFA) 108 (90 BASE) MCG/ACT inhaler Inhale 2 puffs into the lungs every 6 (six) hours as needed.  18 g  6  . cholecalciferol (VITAMIN D) 1000 UNITS tablet Take 2,000 Units by mouth daily.        . Fluticasone-Salmeterol (ADVAIR DISKUS) 250-50 MCG/DOSE AEPB Inhale 1 puff into the lungs every 12 (twelve) hours.  180 each  1  . levothyroxine (SYNTHROID, LEVOTHROID) 50 MCG tablet Take 1 tablet (50 mcg total) by mouth daily.  90 tablet  1  . naproxen sodium (ANAPROX) 220 MG tablet Take 220 mg by mouth as needed.        Marland Kitchen omeprazole (PRILOSEC) 20 MG capsule Take two tablets in the morning and one tablet at night      . [DISCONTINUED] levofloxacin (LEVAQUIN) 500 MG tablet Take 1 tablet (500 mg total) by mouth daily.  7 tablet  0  . [DISCONTINUED] predniSONE (STERAPRED UNI-PAK) 10 MG tablet Take 60mg  day 1 then taper by 10mg  daily  21 tablet  0   BP 119/74  Pulse 73  Temp 97.8 F (36.6 C) (Oral)  Ht 5' 6.5" (1.689 m)  Wt 200 lb 4 oz (90.833 kg)  BMI 31.84 kg/m2  SpO2 97%  Review of Systems  Constitutional: Negative for fever, chills and unexpected weight change.  HENT: Negative for hearing loss, ear pain, nosebleeds, congestion, sore throat, facial swelling, rhinorrhea, sneezing, mouth sores, trouble swallowing, neck pain, neck stiffness, voice change, postnasal drip, sinus pressure, tinnitus and ear discharge.   Eyes: Negative for pain, discharge, redness and visual disturbance.  Respiratory: Positive for chest tightness, shortness of breath and  wheezing. Negative for cough and stridor.   Cardiovascular: Negative for chest pain, palpitations and leg swelling.  Musculoskeletal: Negative for myalgias and arthralgias.  Skin: Negative for color change and rash.  Neurological: Negative for dizziness, weakness, light-headedness and headaches.  Hematological: Negative for adenopathy.       Objective:   Physical Exam  Constitutional: She is oriented to person, place, and time. She appears well-developed and well-nourished. No distress.  HENT:  Head: Normocephalic and atraumatic.  Right Ear: External ear normal.  Left Ear: External ear normal.  Nose: Nose normal.  Mouth/Throat: Oropharynx is clear and moist. No oropharyngeal exudate.  Eyes: Conjunctivae normal are normal. Pupils are equal, round, and reactive to light. Right eye exhibits no discharge. Left eye exhibits no discharge. No scleral icterus.  Neck: Normal range of motion. Neck supple. No tracheal deviation present. No thyromegaly present.  Cardiovascular: Normal rate, regular rhythm, normal heart sounds and intact distal pulses.  Exam reveals no gallop and no friction rub.   No murmur heard. Pulmonary/Chest: Effort normal. No accessory muscle usage. Not tachypneic. No respiratory distress. She has no decreased breath sounds. She has no wheezes. She has rhonchi in the left lower field. She has no rales. She exhibits no tenderness.  Musculoskeletal: Normal range of motion. She exhibits no edema and no tenderness.  Lymphadenopathy:    She has no cervical adenopathy.  Neurological: She is alert and oriented to person,  place, and time. No cranial nerve deficit. She exhibits normal muscle tone. Coordination normal.  Skin: Skin is warm and dry. No rash noted. She is not diaphoretic. No erythema. No pallor.  Psychiatric: She has a normal mood and affect. Her behavior is normal. Judgment and thought content normal.          Assessment & Plan:

## 2012-07-17 ENCOUNTER — Encounter: Payer: Self-pay | Admitting: Internal Medicine

## 2012-07-17 ENCOUNTER — Ambulatory Visit (INDEPENDENT_AMBULATORY_CARE_PROVIDER_SITE_OTHER): Payer: BC Managed Care – PPO | Admitting: Internal Medicine

## 2012-07-17 ENCOUNTER — Other Ambulatory Visit (HOSPITAL_COMMUNITY)
Admission: RE | Admit: 2012-07-17 | Discharge: 2012-07-17 | Disposition: A | Discharge status: Home / Self Care | Payer: BC Managed Care – PPO | Source: Ambulatory Visit | Attending: Internal Medicine | Admitting: Internal Medicine

## 2012-07-17 VITALS — BP 118/80 | HR 84 | Temp 98.6°F | Ht 66.5 in | Wt 201.0 lb

## 2012-07-17 DIAGNOSIS — Z0000 Encounter for general adult medical examination without abnormal findings: Secondary | ICD-10-CM

## 2012-07-17 DIAGNOSIS — Z01419 Encounter for gynecological examination (general) (routine) without abnormal findings: Secondary | ICD-10-CM | POA: Insufficient documentation

## 2012-07-17 DIAGNOSIS — Z1151 Encounter for screening for human papillomavirus (HPV): Secondary | ICD-10-CM | POA: Insufficient documentation

## 2012-07-17 LAB — LIPID PANEL
Cholesterol: 202 mg/dL — ABNORMAL HIGH (ref 0–200)
Total CHOL/HDL Ratio: 3
Triglycerides: 70 mg/dL (ref 0.0–149.0)
VLDL: 14 mg/dL (ref 0.0–40.0)

## 2012-07-17 LAB — CBC WITH DIFFERENTIAL/PLATELET
Eosinophils Relative: 4.4 % (ref 0.0–5.0)
HCT: 43.3 % (ref 36.0–46.0)
Hemoglobin: 14.6 g/dL (ref 12.0–15.0)
Lymphs Abs: 3.2 10*3/uL (ref 0.7–4.0)
Monocytes Relative: 6.2 % (ref 3.0–12.0)
Neutro Abs: 4 10*3/uL (ref 1.4–7.7)
RBC: 4.98 Mil/uL (ref 3.87–5.11)
WBC: 8.2 10*3/uL (ref 4.5–10.5)

## 2012-07-17 LAB — HM PAP SMEAR: HM PAP: NEGATIVE

## 2012-07-17 LAB — MICROALBUMIN / CREATININE URINE RATIO: Microalb, Ur: 1 mg/dL (ref 0.0–1.9)

## 2012-07-17 LAB — TSH: TSH: 1.47 u[IU]/mL (ref 0.35–5.50)

## 2012-07-17 LAB — COMPREHENSIVE METABOLIC PANEL
CO2: 28 mEq/L (ref 19–32)
Creatinine, Ser: 1.1 mg/dL (ref 0.4–1.2)
GFR: 53.48 mL/min — ABNORMAL LOW (ref >60.00)
Glucose, Bld: 93 mg/dL (ref 70–99)
Total Bilirubin: 0.7 mg/dL (ref 0.3–1.2)

## 2012-07-17 NOTE — Assessment & Plan Note (Signed)
Gen medical exam normal today including breast exam and pelvic exam. PAP pending. Will order mammogram. Will request previous bone density testing results.  Will check basic labs including CBC, CMP, lipids. Follow up 6 months and prn.

## 2012-07-17 NOTE — Progress Notes (Signed)
Subjective:    Patient ID: Elizabeth Rice, female    DOB: [birth year], 57 y.o.   MRN: [id number]  HPI 57 year old female with history of hypothyroidism and chronic kidney disease presents for annual exam. It has been a difficult time for her. Her daughter is [redacted] weeks pregnant and lost her child unexpectedly. She has been providing support for her daughter and grieving. She feels that she is coping well. She has no new concerns today. She is trying to follow a healthy diet and get regular physical activity. She is compliant with medications.  Outpatient Encounter Prescriptions as of 07/17/2012  Medication Sig Dispense Refill  . albuterol (VENTOLIN HFA) 108 (90 BASE) MCG/ACT inhaler Inhale 2 puffs into the lungs every 6 (six) hours as needed.  18 g  6  . cholecalciferol (VITAMIN D) 1000 UNITS tablet Take 2,000 Units by mouth daily.        . Fluticasone-Salmeterol (ADVAIR DISKUS) 250-50 MCG/DOSE AEPB Inhale 1 puff into the lungs every 12 (twelve) hours.  180 each  1  . levothyroxine (SYNTHROID, LEVOTHROID) 50 MCG tablet Take 1 tablet (50 mcg total) by mouth daily.  90 tablet  3  . naproxen sodium (ANAPROX) 220 MG tablet Take 220 mg by mouth as needed.        Marland Kitchen omeprazole (PRILOSEC) 20 MG capsule Take two tablets in the morning and one tablet at night       No facility-administered encounter medications on file as of 07/17/2012.   BP 118/80  Pulse 84  Temp(Src) 98.6 F (37 C) (Oral)  Ht 5' 6.5" (1.689 m)  Wt 201 lb (91.173 kg)  BMI 31.96 kg/m2  SpO2 96%  Review of Systems  Constitutional: Negative for fever, chills, appetite change, fatigue and unexpected weight change.  HENT: Negative for ear pain, congestion, sore throat, trouble swallowing, neck pain, voice change and sinus pressure.   Eyes: Negative for visual disturbance.  Respiratory: Negative for cough, shortness of breath, wheezing and stridor.   Cardiovascular: Negative for chest pain, palpitations and leg swelling.  Gastrointestinal:  Negative for nausea, vomiting, abdominal pain, diarrhea, constipation, blood in stool, abdominal distention and anal bleeding.  Genitourinary: Negative for dysuria and flank pain.  Musculoskeletal: Negative for myalgias, arthralgias and gait problem.  Skin: Negative for color change and rash.  Neurological: Negative for dizziness and headaches.  Hematological: Negative for adenopathy. Does not bruise/bleed easily.  Psychiatric/Behavioral: Positive for dysphoric mood. Negative for suicidal ideas and sleep disturbance. The patient is not nervous/anxious.        Objective:   Physical Exam  Constitutional: She is oriented to person, place, and time. She appears well-developed and well-nourished. No distress.  HENT:  Head: Normocephalic and atraumatic.  Right Ear: External ear normal. Decreased hearing is noted.  Left Ear: External ear normal. Decreased hearing is noted.  Nose: Nose normal.  Mouth/Throat: Oropharynx is clear and moist. No oropharyngeal exudate.  Eyes: Conjunctivae are normal. Pupils are equal, round, and reactive to light. Right eye exhibits no discharge. Left eye exhibits no discharge. No scleral icterus.  Neck: Normal range of motion. Neck supple. No tracheal deviation present. No thyromegaly present.  Cardiovascular: Normal rate, regular rhythm, normal heart sounds and intact distal pulses.  Exam reveals no gallop and no friction rub.   No murmur heard. Pulmonary/Chest: Effort normal and breath sounds normal. No respiratory distress. She has no wheezes. She has no rales. She exhibits no tenderness.  Abdominal: Soft. Bowel sounds are normal. She exhibits no  distension and no mass. There is no tenderness. There is no rebound and no guarding.  Genitourinary: Rectum normal, vagina normal and uterus normal. No breast swelling, tenderness, discharge or bleeding. Pelvic exam was performed with patient supine. There is no rash, tenderness or lesion on the right labia. There is no  rash, tenderness or lesion on the left labia. Uterus is not enlarged and not tender. Cervix exhibits no motion tenderness, no discharge and no friability. Right adnexum displays no mass, no tenderness and no fullness. Left adnexum displays no mass, no tenderness and no fullness. No erythema or tenderness around the vagina. No vaginal discharge found.  Musculoskeletal: Normal range of motion. She exhibits no edema and no tenderness.  Lymphadenopathy:    She has no cervical adenopathy.  Neurological: She is alert and oriented to person, place, and time. No cranial nerve deficit. She exhibits normal muscle tone. Coordination normal.  Skin: Skin is warm and dry. No rash noted. She is not diaphoretic. No erythema. No pallor.  Psychiatric: She has a normal mood and affect. Her behavior is normal. Judgment and thought content normal.          Assessment & Plan:

## 2012-07-22 ENCOUNTER — Encounter: Payer: Self-pay | Admitting: *Deleted

## 2012-07-22 ENCOUNTER — Encounter: Payer: Self-pay | Admitting: Internal Medicine

## 2012-07-29 ENCOUNTER — Encounter: Payer: Self-pay | Admitting: *Deleted

## 2012-08-13 ENCOUNTER — Ambulatory Visit (INDEPENDENT_AMBULATORY_CARE_PROVIDER_SITE_OTHER): Payer: BC Managed Care – PPO | Admitting: Adult Health

## 2012-08-13 ENCOUNTER — Encounter: Payer: Self-pay | Admitting: Adult Health

## 2012-08-13 VITALS — BP 128/72 | HR 68 | Temp 98.0°F | Resp 18 | Wt 204.5 lb

## 2012-08-13 DIAGNOSIS — H6690 Otitis media, unspecified, unspecified ear: Secondary | ICD-10-CM

## 2012-08-13 DIAGNOSIS — H6691 Otitis media, unspecified, right ear: Secondary | ICD-10-CM

## 2012-08-13 MED ORDER — ANTIPYRINE-BENZOCAINE 5.4-1.4 % OT SOLN: 3.0000 [drp] | OTIC | Status: DC | PRN

## 2012-08-13 MED ORDER — AMOXICILLIN-POT CLAVULANATE 875-125 MG PO TABS: 1.0000 | ORAL_TABLET | Freq: Two times a day (BID) | ORAL | Status: DC

## 2012-08-13 NOTE — Patient Instructions (Addendum)
  Start your antibiotic today. You will take this for 10 days.  I have prescribed ear drops for your discomfort. You may use this every 2-3 hours as needed for pain/discomfort.  Keep sinuses flushed. You can use an over the counter products such as simple saline.  If your symptoms do not improve within 3-4 days, please call.

## 2012-08-13 NOTE — Progress Notes (Signed)
  Subjective:    Patient ID: Elizabeth Rice, female    DOB: 01/24/1956, 57 y.o.   MRN: [id number]  HPI  Patient is a 57 year old female who presents to clinic with complaints of right ear pain and sensation of fullness. She is also hypersensitive to sounds. She uses a hearing aid on the right side and feels that the sounds have just been intensified. She denies fever or chills. She has not tried any over-the-counter products. Her symptoms began yesterday.   Current Outpatient Prescriptions on File Prior to Visit  Medication Sig Dispense Refill  . albuterol (VENTOLIN HFA) 108 (90 BASE) MCG/ACT inhaler Inhale 2 puffs into the lungs every 6 (six) hours as needed.  18 g  6  . cholecalciferol (VITAMIN D) 1000 UNITS tablet Take 2,000 Units by mouth daily.        . Fluticasone-Salmeterol (ADVAIR DISKUS) 250-50 MCG/DOSE AEPB Inhale 1 puff into the lungs every 12 (twelve) hours.  180 each  1  . levothyroxine (SYNTHROID, LEVOTHROID) 50 MCG tablet Take 1 tablet (50 mcg total) by mouth daily.  90 tablet  3  . naproxen sodium (ANAPROX) 220 MG tablet Take 220 mg by mouth as needed.        Marland Kitchen omeprazole (PRILOSEC) 20 MG capsule Take two tablets in the morning and one tablet at night       No current facility-administered medications on file prior to visit.     Review of Systems  Constitutional: Negative for fever and chills.  HENT: Positive for ear pain. Negative for congestion and postnasal drip.        Right ear fullness sensation. Hypersensitivity to sounds.  Respiratory: Negative for cough.    BP 128/72  Pulse 68  Temp(Src) 98 F (36.7 C) (Oral)  Resp 18  Wt 204 lb 8 oz (92.761 kg)  BMI 32.52 kg/m2  SpO2 98%     Objective:   Physical Exam  Constitutional: She is oriented to person, place, and time. She appears well-developed and well-nourished.  Appears to be in pain  HENT:  Head: Normocephalic and atraumatic.  Left Ear: External ear normal.  Nose: Nose normal.  Mouth/Throat: Oropharynx  is clear and moist.  Right ear canal is erythematous. There is also slight bulging and erythema noted on right TM.  Lymphadenopathy:    She has no cervical adenopathy.  Neurological: She is alert and oriented to person, place, and time.  Psychiatric: She has a normal mood and affect. Her behavior is normal. Judgment and thought content normal.       Assessment & Plan:

## 2012-08-13 NOTE — Assessment & Plan Note (Signed)
Start Augmentin. Also start Auralgan gtts for pain/discomfort. RTC if symptoms do not improve within 3-4 days.

## 2012-08-21 ENCOUNTER — Encounter: Payer: Self-pay | Admitting: Internal Medicine

## 2012-09-25 ENCOUNTER — Other Ambulatory Visit: Payer: Self-pay | Admitting: Internal Medicine

## 2012-09-26 ENCOUNTER — Other Ambulatory Visit: Payer: Self-pay | Admitting: Internal Medicine

## 2012-09-26 NOTE — Telephone Encounter (Signed)
refill completed 09/22/12 with pharmacy confirmation

## 2012-10-10 ENCOUNTER — Encounter: Payer: Self-pay | Admitting: Internal Medicine

## 2012-11-06 ENCOUNTER — Encounter: Payer: Self-pay | Admitting: Internal Medicine

## 2012-11-07 ENCOUNTER — Ambulatory Visit (INDEPENDENT_AMBULATORY_CARE_PROVIDER_SITE_OTHER): Payer: BC Managed Care – PPO | Admitting: Adult Health

## 2012-11-07 ENCOUNTER — Encounter: Payer: Self-pay | Admitting: Adult Health

## 2012-11-07 VITALS — BP 110/70 | HR 68 | Temp 98.3°F | Resp 12 | Wt 206.0 lb

## 2012-11-07 DIAGNOSIS — H6690 Otitis media, unspecified, unspecified ear: Secondary | ICD-10-CM

## 2012-11-07 DIAGNOSIS — H6691 Otitis media, unspecified, right ear: Secondary | ICD-10-CM

## 2012-11-07 MED ORDER — MECLIZINE HCL 25 MG PO TABS: 25.0000 mg | ORAL_TABLET | Freq: Three times a day (TID) | ORAL | Status: DC | PRN

## 2012-11-07 MED ORDER — AMOXICILLIN-POT CLAVULANATE 875-125 MG PO TABS: 1.0000 | ORAL_TABLET | Freq: Two times a day (BID) | ORAL | Status: DC

## 2012-11-07 NOTE — Progress Notes (Signed)
  Subjective:    Patient ID: Elizabeth Rice, female    DOB: 10-29-1955, 57 y.o.   MRN: [id number]  HPI  Patient is a very pleasant 57 year old female who is hearing impaired on the right who presents to clinic with complaints of fullness feeling in her right ear and occasional dizziness. She also has hypersensitivity to sound. Patient was seen in clinic in the beginning of April with the same symptoms. She reports good resolution of symptoms after being placed on antibiotics and drops for ear pain.   Current Outpatient Prescriptions on File Prior to Visit  Medication Sig Dispense Refill  . ADVAIR DISKUS 250-50 MCG/DOSE AEPB INHALE 1 PUFF BY MOUTH EVERY 12 HOURS  180 each  0  . albuterol (VENTOLIN HFA) 108 (90 BASE) MCG/ACT inhaler Inhale 2 puffs into the lungs every 6 (six) hours as needed.  18 g  6  . cholecalciferol (VITAMIN D) 1000 UNITS tablet Take 2,000 Units by mouth daily.        Marland Kitchen levothyroxine (SYNTHROID, LEVOTHROID) 50 MCG tablet Take 1 tablet (50 mcg total) by mouth daily.  90 tablet  3  . naproxen sodium (ANAPROX) 220 MG tablet Take 220 mg by mouth as needed.        Marland Kitchen omeprazole (PRILOSEC) 20 MG capsule Take two tablets in the morning and one tablet at night       No current facility-administered medications on file prior to visit.     Review of Systems  Constitutional: Negative for fever.  HENT: Positive for sinus pressure. Negative for ear pain, tinnitus and ear discharge.        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 Patient with hypersensitivity to sound.  Respiratory: Negative.   Cardiovascular: Negative.   Neurological: Positive for dizziness.       Vertigo  Psychiatric/Behavioral: Negative.        Objective:   Physical Exam  Constitutional: She is oriented to person, place, and time. She appears well-developed and well-nourished. No distress.  HENT:  Head: Normocephalic and atraumatic.  Left Ear: External ear normal.  TM on the right with bulging. Mild  inflammation of the canal.  Lymphadenopathy:    She has no cervical adenopathy.  Neurological: She is alert and oriented to person, place, and time.  Psychiatric: She has a normal mood and affect. Her behavior is normal. Judgment and thought content normal.          Assessment & Plan:

## 2012-11-07 NOTE — Assessment & Plan Note (Signed)
Start Augmentin twice a day x10 days. May use meclizine for vertigo. RTC if symptoms are not improved within 3-4 days.

## 2013-01-21 ENCOUNTER — Ambulatory Visit (INDEPENDENT_AMBULATORY_CARE_PROVIDER_SITE_OTHER): Payer: BC Managed Care – PPO | Admitting: Internal Medicine

## 2013-01-21 ENCOUNTER — Encounter: Payer: Self-pay | Admitting: Internal Medicine

## 2013-01-21 VITALS — BP 124/98 | HR 74 | Temp 98.2°F | Wt 209.0 lb

## 2013-01-21 DIAGNOSIS — N182 Chronic kidney disease, stage 2 (mild): Secondary | ICD-10-CM

## 2013-01-21 DIAGNOSIS — M545 Low back pain, unspecified: Secondary | ICD-10-CM

## 2013-01-21 DIAGNOSIS — J45909 Unspecified asthma, uncomplicated: Secondary | ICD-10-CM

## 2013-01-21 DIAGNOSIS — J309 Allergic rhinitis, unspecified: Secondary | ICD-10-CM

## 2013-01-21 DIAGNOSIS — G8929 Other chronic pain: Secondary | ICD-10-CM

## 2013-01-21 DIAGNOSIS — Z23 Encounter for immunization: Secondary | ICD-10-CM

## 2013-01-21 DIAGNOSIS — R42 Dizziness and giddiness: Secondary | ICD-10-CM

## 2013-01-21 DIAGNOSIS — M542 Cervicalgia: Secondary | ICD-10-CM | POA: Insufficient documentation

## 2013-01-21 DIAGNOSIS — E039 Hypothyroidism, unspecified: Secondary | ICD-10-CM

## 2013-01-21 LAB — COMPREHENSIVE METABOLIC PANEL WITH GFR
ALT: 27 U/L (ref 0–35)
AST: 24 U/L (ref 0–37)
Albumin: 4.5 g/dL (ref 3.5–5.2)
Alkaline Phosphatase: 83 U/L (ref 39–117)
BUN: 21 mg/dL (ref 6–23)
CO2: 29 meq/L (ref 19–32)
Calcium: 9.4 mg/dL (ref 8.4–10.5)
Chloride: 102 meq/L (ref 96–112)
Creatinine, Ser: 1.1 mg/dL (ref 0.4–1.2)
GFR: 55.67 mL/min — ABNORMAL LOW
Glucose, Bld: 104 mg/dL — ABNORMAL HIGH (ref 70–99)
Potassium: 4.2 meq/L (ref 3.5–5.1)
Sodium: 139 meq/L (ref 135–145)
Total Bilirubin: 0.5 mg/dL (ref 0.3–1.2)
Total Protein: 7.3 g/dL (ref 6.0–8.3)

## 2013-01-21 LAB — MICROALBUMIN / CREATININE URINE RATIO
Creatinine,U: 170.8 mg/dL
Microalb Creat Ratio: 0.2 mg/g (ref 0.0–30.0)

## 2013-01-21 MED ORDER — FLUTICASONE-SALMETEROL 250-50 MCG/DOSE IN AEPB: INHALATION_SPRAY | RESPIRATORY_TRACT | Status: DC

## 2013-01-21 MED ORDER — TRAMADOL HCL 50 MG PO TABS: 50.0000 mg | ORAL_TABLET | Freq: Three times a day (TID) | ORAL | Status: DC | PRN

## 2013-01-21 MED ORDER — FLUTICASONE PROPIONATE 50 MCG/ACT NA SUSP: 2.0000 | Freq: Every day | NASAL | Status: DC

## 2013-01-21 MED ORDER — MECLIZINE HCL 25 MG PO TABS: 25.0000 mg | ORAL_TABLET | Freq: Three times a day (TID) | ORAL | Status: DC | PRN

## 2013-01-21 MED ORDER — ALBUTEROL SULFATE HFA 108 (90 BASE) MCG/ACT IN AERS: 2.0000 | INHALATION_SPRAY | Freq: Four times a day (QID) | RESPIRATORY_TRACT | Status: DC | PRN

## 2013-01-21 MED ORDER — CYCLOBENZAPRINE HCL 5 MG PO TABS: 5.0000 mg | ORAL_TABLET | Freq: Three times a day (TID) | ORAL | Status: DC | PRN

## 2013-01-21 MED ORDER — LEVOTHYROXINE SODIUM 50 MCG PO TABS: 50.0000 ug | ORAL_TABLET | Freq: Every day | ORAL | Status: DC

## 2013-01-21 NOTE — Assessment & Plan Note (Signed)
History of chronic neck pain and a cervical spine fusion in the past. Spasm noted today over the left trapezius. Will start Flexeril. Will also use tramadol as needed for pain. Will set up physical therapy evaluation.

## 2013-01-21 NOTE — Assessment & Plan Note (Signed)
Recent increased frequency of episodes of vertigo. Bilateral middle ear effusions noted on exam. Suspect symptoms related to seasonal allergies. Will start nasal Flonase to see if any improvement. We'll use meclizine as needed for vertigo.

## 2013-01-21 NOTE — Assessment & Plan Note (Signed)
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 Recent exacerbation of symptoms with clear nasal drainage, sneezing, watery eyes, and occasional increased shortness of breath. Encouraged avoidance of allergens.Will add Flonase to sertraline. Followup as needed.

## 2013-01-21 NOTE — Progress Notes (Signed)
Subjective:    Patient ID: Elizabeth Rice, female    DOB: 09/05/1955, 57 y.o.   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 Her primary concern today is recent worsening of chronic neck and lower back pain. She reports chronic aching pain in her neck with spasm and tension in the muscles on the left side of her neck which is made worse with rotation of her head. She has been taking over-the-counter Naprosyn with minimal improvement. She also notes chronic aching pain in her lower back which occasionally radiates down the back of her legs. She denies weakness in her legs. She denies loss of control of bowel or bladder.  In regards to history of seasonal allergies, she notes that grass exposure tends to trigger her symptoms. When mowing the yard, she developed clear watery drainage from her nose, sneezing, watery eyes, and mild shortness of breath. This improves with use of albuterol. She is also taking Zyrtec.  Outpatient Encounter Prescriptions as of 01/21/2013  Medication Sig Dispense Refill  . albuterol (VENTOLIN HFA) 108 (90 BASE) MCG/ACT inhaler Inhale 2 puffs into the lungs every 6 (six) hours as needed.  18 g  6  . cholecalciferol (VITAMIN D) 1000 UNITS tablet Take 2,000 Units by mouth daily.        . Fluticasone-Salmeterol (ADVAIR DISKUS) 250-50 MCG/DOSE AEPB INHALE 1 PUFF BY MOUTH EVERY 12 HOURS  3 each  3  . levothyroxine (SYNTHROID, LEVOTHROID) 50 MCG tablet Take 1 tablet (50 mcg total) by mouth daily.  90 tablet  3  . naproxen sodium (ANAPROX) 220 MG tablet Take 220 mg by mouth as needed.        Marland Kitchen omeprazole (PRILOSEC) 20 MG capsule Take two tablets in the morning and one tablet at night      . [DISCONTINUED] ADVAIR DISKUS 250-50 MCG/DOSE AEPB INHALE 1 PUFF BY MOUTH EVERY 12 HOURS  180 each  0   . cyclobenzaprine (FLEXERIL) 5 MG tablet Take 1 tablet (5 mg total) by mouth 3 (three) times daily as needed for muscle spasms.  30 tablet  1  . fluticasone (FLONASE) 50 MCG/ACT nasal spray Place 2 sprays into the nose daily.  16 g  6  . meclizine (ANTIVERT) 25 MG tablet Take 1 tablet (25 mg total) by mouth 3 (three) times daily as needed.  30 tablet  0  . traMADol (ULTRAM) 50 MG tablet Take 1 tablet (50 mg total) by mouth every 8 (eight) hours as needed for pain.  60 tablet  3   No facility-administered encounter medications on file as of 01/21/2013.   BP 124/98  Pulse 74  Temp(Src) 98.2 F (36.8 C) (Oral)  Wt 209 lb (94.802 kg)  BMI 33.23 kg/m2  SpO2 96%  Review of Systems  Constitutional: Negative for fever, chills, appetite change, fatigue and unexpected weight change.  HENT: Positive for rhinorrhea, sneezing, neck pain and postnasal drip. Negative for ear pain, congestion, sore throat, trouble swallowing, voice change and sinus pressure.   Eyes: Negative for redness and visual disturbance.  Respiratory: Negative for cough, shortness of breath, wheezing and stridor.   Cardiovascular: Negative for chest pain, palpitations and leg swelling.  Gastrointestinal: Negative for nausea, vomiting, abdominal pain, diarrhea, constipation, blood in stool, abdominal distention and anal bleeding.  Genitourinary: Negative for dysuria and flank pain.  Musculoskeletal: Positive for  back pain. Negative for myalgias, arthralgias and gait problem.  Skin: Negative for color change and rash.  Allergic/Immunologic: Positive for environmental allergies.  Neurological: Positive for dizziness. Negative for headaches.  Hematological: Negative for adenopathy. Does not bruise/bleed easily.  Psychiatric/Behavioral: Negative for suicidal ideas, sleep disturbance and dysphoric mood. The patient is not nervous/anxious.        Objective:   Physical Exam  Constitutional: She is oriented to person, place, and  time. She appears well-developed and well-nourished. No distress.  HENT:  Head: Normocephalic and atraumatic.  Right Ear: External ear normal.  Left Ear: External ear normal.  Nose: Nose normal.  Mouth/Throat: Oropharynx is clear and moist. No oropharyngeal exudate.  Eyes: Conjunctivae are normal. Pupils are equal, round, and reactive to light. Right eye exhibits no discharge. Left eye exhibits no discharge. No scleral icterus.  Neck: Normal range of motion. Neck supple. No tracheal deviation present. No thyromegaly present.  Cardiovascular: Normal rate, regular rhythm, normal heart sounds and intact distal pulses.  Exam reveals no gallop and no friction rub.   No murmur heard. Pulmonary/Chest: Effort normal and breath sounds normal. No accessory muscle usage. Not tachypneic. No respiratory distress. She has no decreased breath sounds. She has no wheezes. She has no rhonchi. She has no rales. She exhibits no tenderness.  Musculoskeletal: Normal range of motion. She exhibits no edema.       Cervical back: She exhibits tenderness, pain and spasm. She exhibits normal range of motion and no bony tenderness.       Lumbar back: She exhibits pain. She exhibits normal range of motion, no tenderness and no bony tenderness.       Back:  Lymphadenopathy:    She has no cervical adenopathy.  Neurological: She is alert and oriented to person, place, and time. No cranial nerve deficit. She exhibits normal muscle tone. Coordination normal.  Skin: Skin is warm and dry. No rash noted. She is not diaphoretic. No erythema. No pallor.  Psychiatric: She has a normal mood and affect. Her behavior is normal. Judgment and thought content normal.          Assessment & Plan:

## 2013-01-21 NOTE — Assessment & Plan Note (Signed)
Patient with chronic history of low back pain and history of ruptured disc. Will add Flexeril and tramadol to see if any improvement in symptoms. Will also set up physical therapy.

## 2013-01-21 NOTE — Assessment & Plan Note (Signed)
History of chronic kidney disease. Will check renal function with labs today.

## 2013-01-21 NOTE — Assessment & Plan Note (Signed)
Symptoms well controlled with use of Advair and albuterol. Encouraged allergen avoidance. Will start nasal Flonase to help with seasonal allergies which have triggered asthma.

## 2013-02-16 IMAGING — MG MM CAD SCREENING MAMMO
1 series · 4 of 4 positions shown · non-contrast
Comparison: none

REASON FOR EXAM: SCR MAMMO NO ORDER
COMMENTS:

PROCEDURE:     MAM - MAM DGTL SCRN MAM NO ORDER W/CAD  - September 24, 2011  [DATE]
RESULT:
COMPARISON STUDY:    08/29/2007, 08/15/2006, 02/24/2010, 02/03/2009.

[Series 6365: R CC · right · 4 of 4 slices shown]
[im 1/4]
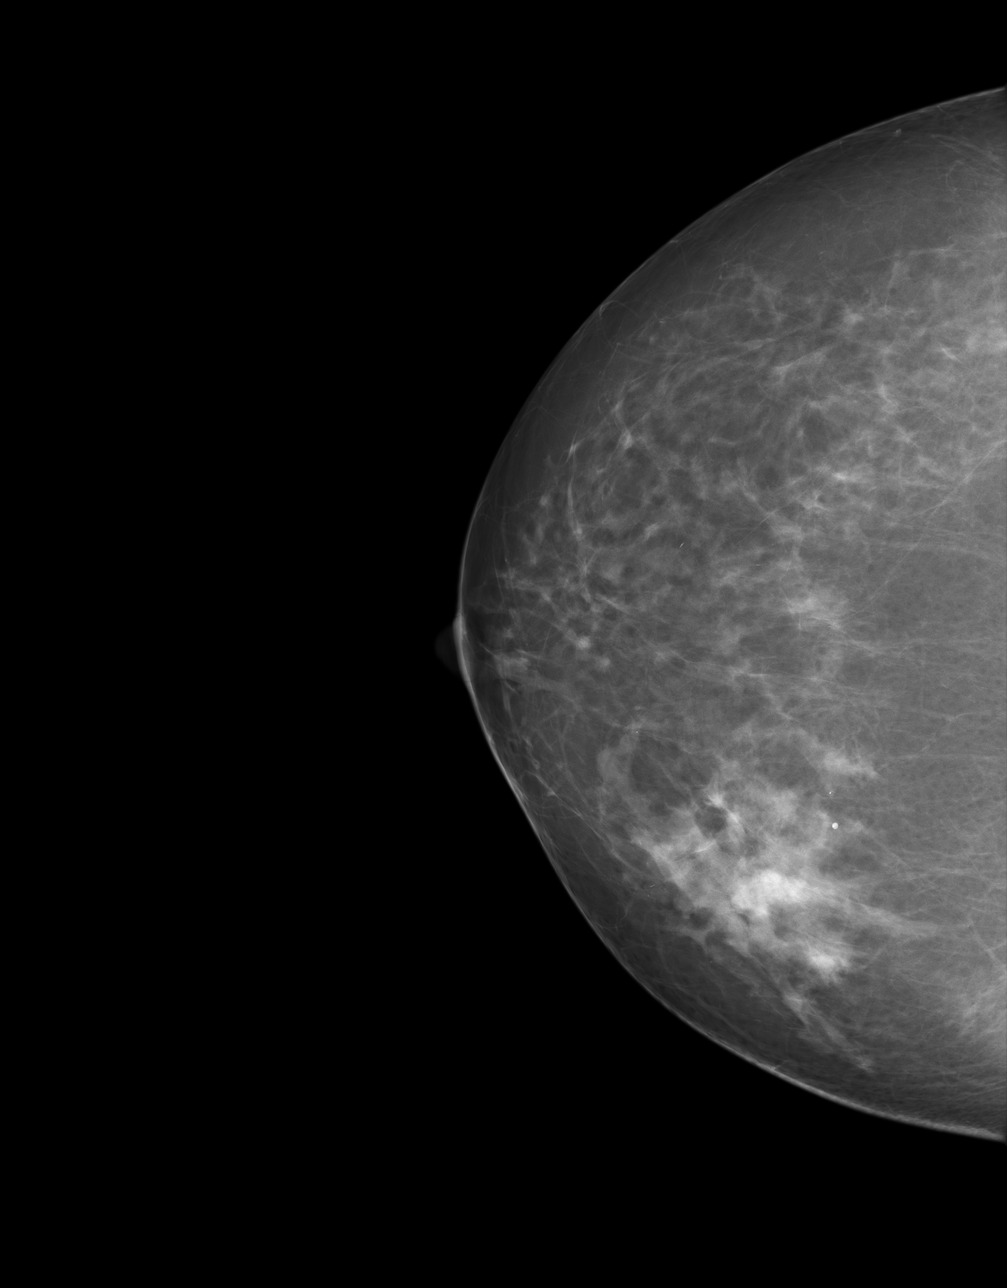
[im 2/4]
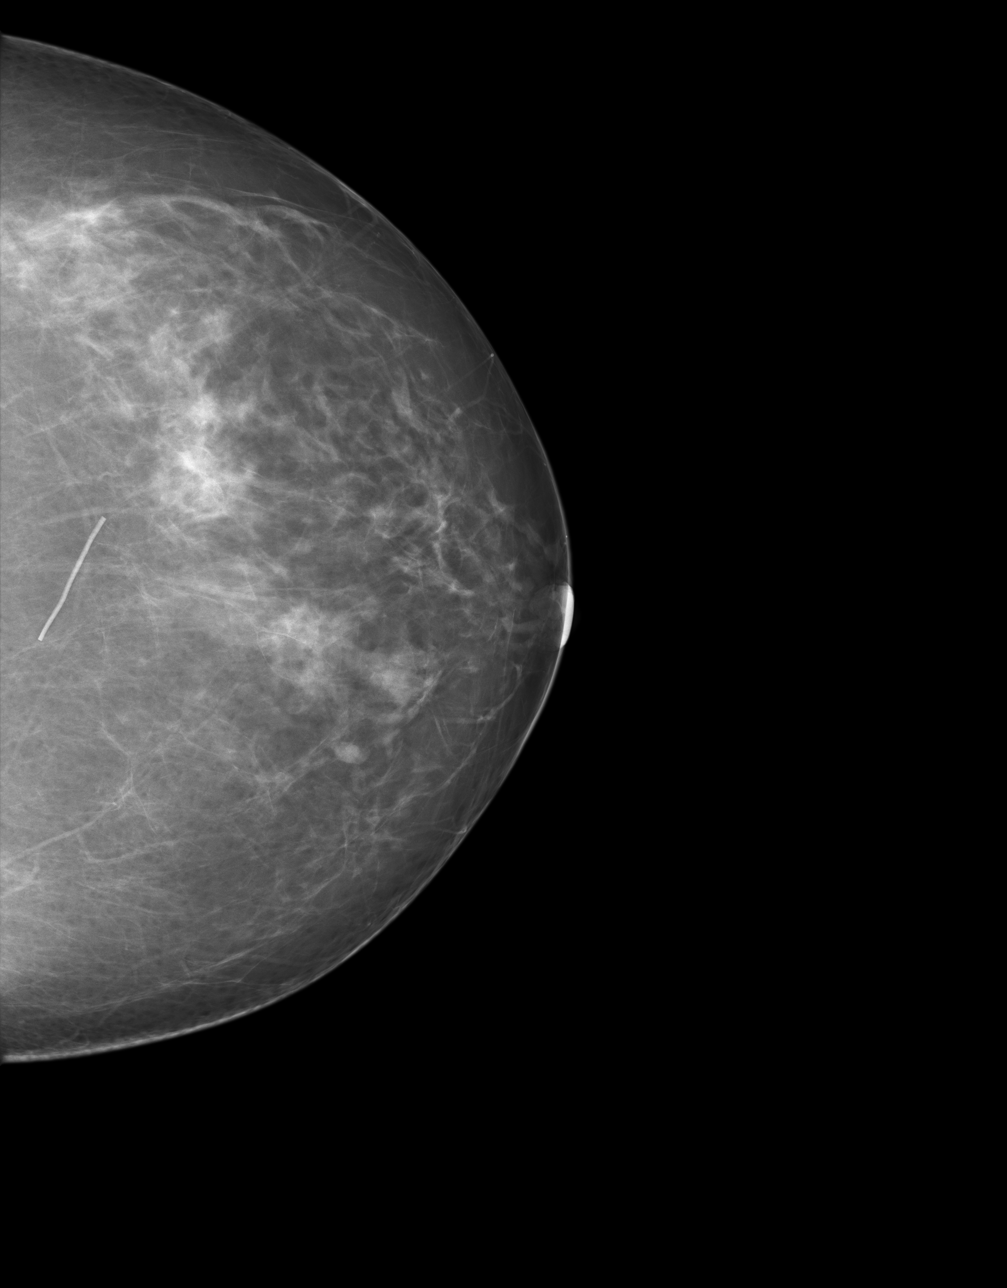
[im 3/4]
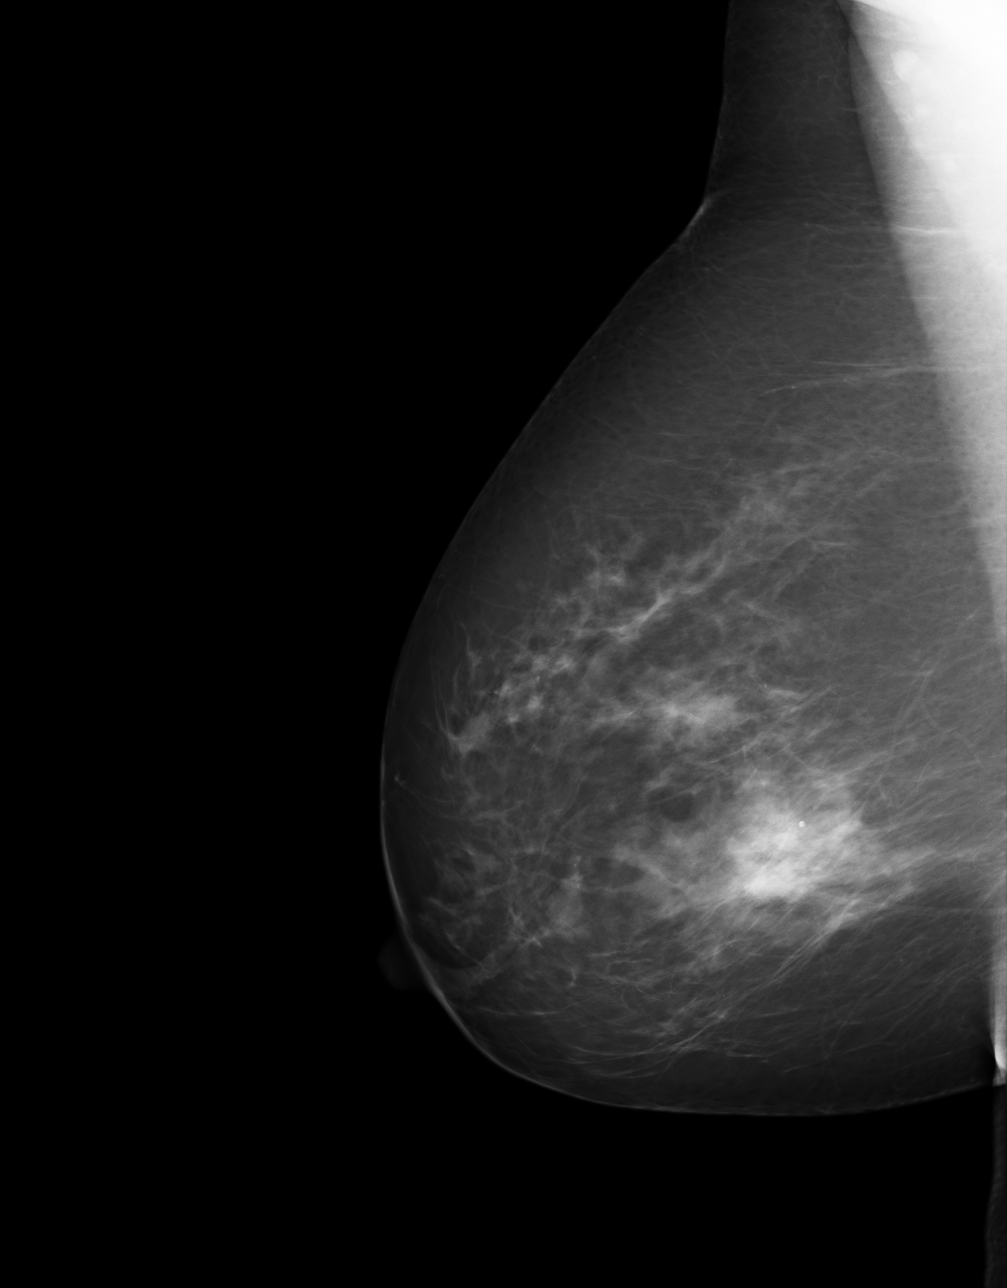
[im 4/4]
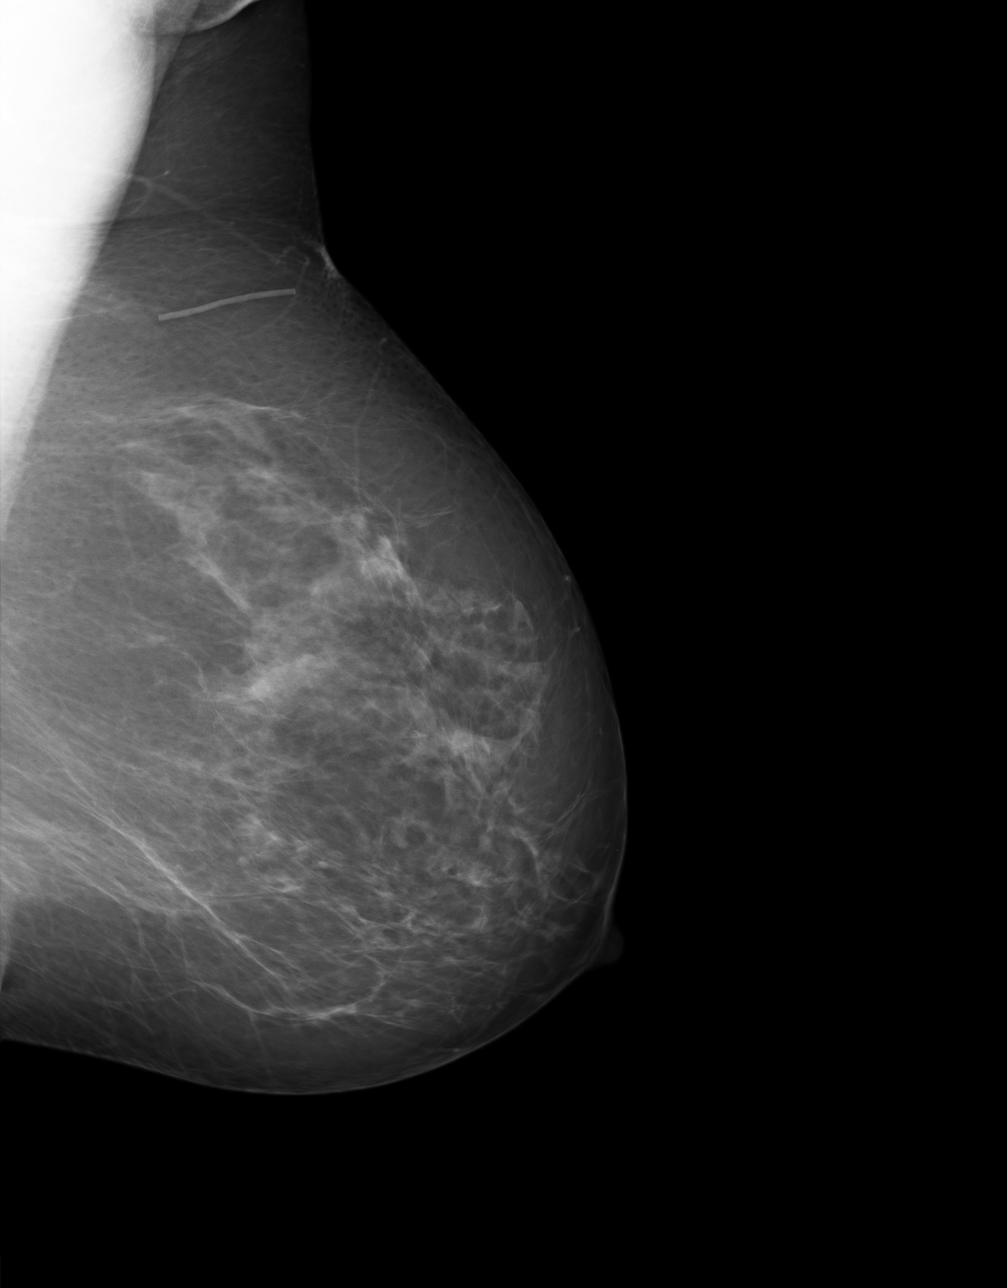

[4 of 4 positions shown; findings below may reference images not displayed]

FINDINGS: The breast tissue is heterogeneously dense. No suspicious masses
or calcifications are identified. No areas of architectural distortion.
IMPRESSION: 1.     BI-RADS: Category 1-Negative.
2.     Recommend continued annual screening mammography.

Thank you for the opportunity to contribute to the care of your patient.

A negative mammogram report does not preclude biopsy or other evaluation of
a clinically palpable or otherwise suspicious mass or lesion.  Breast cancer
may not be detected by mammography in up to 10% of cases.

## 2013-02-24 ENCOUNTER — Encounter: Payer: Self-pay | Admitting: Internal Medicine

## 2013-02-24 ENCOUNTER — Ambulatory Visit (INDEPENDENT_AMBULATORY_CARE_PROVIDER_SITE_OTHER): Payer: BC Managed Care – PPO | Admitting: Internal Medicine

## 2013-02-24 VITALS — BP 130/82 | HR 68 | Temp 98.3°F | Wt 210.0 lb

## 2013-02-24 DIAGNOSIS — M545 Low back pain: Secondary | ICD-10-CM

## 2013-02-24 DIAGNOSIS — G8929 Other chronic pain: Secondary | ICD-10-CM

## 2013-02-24 DIAGNOSIS — Z23 Encounter for immunization: Secondary | ICD-10-CM

## 2013-02-24 NOTE — Assessment & Plan Note (Addendum)
Secondary to DJD. Symptoms improved with PT. Rx written today for TENS unit.  Will continue to monitor.

## 2013-02-24 NOTE — Progress Notes (Signed)
Subjective:    Patient ID: Elizabeth Rice, female    DOB: 12-21-1955, 57 y.o.   MRN: [id number]  HPI 57YO female presents to follow up back pain. Started PT after last visit. Symptoms much improved. Minimal occasional aching pain in the lower back. Would like to start using a TENS unit as well.  Outpatient Encounter Prescriptions as of 02/24/2013  Medication Sig Dispense Refill  . albuterol (VENTOLIN HFA) 108 (90 BASE) MCG/ACT inhaler Inhale 2 puffs into the lungs every 6 (six) hours as needed.  18 g  6  . cholecalciferol (VITAMIN D) 1000 UNITS tablet Take 2,000 Units by mouth daily.        . cyclobenzaprine (FLEXERIL) 5 MG tablet Take 1 tablet (5 mg total) by mouth 3 (three) times daily as needed for muscle spasms.  30 tablet  1  . fluticasone (FLONASE) 50 MCG/ACT nasal spray Place 2 sprays into the nose daily.  16 g  6  . Fluticasone-Salmeterol (ADVAIR DISKUS) 250-50 MCG/DOSE AEPB INHALE 1 PUFF BY MOUTH EVERY 12 HOURS  3 each  3  . levothyroxine (SYNTHROID, LEVOTHROID) 50 MCG tablet Take 1 tablet (50 mcg total) by mouth daily.  90 tablet  3  . omeprazole (PRILOSEC) 20 MG capsule Take two tablets in the morning and one tablet at night      . meclizine (ANTIVERT) 25 MG tablet Take 1 tablet (25 mg total) by mouth 3 (three) times daily as needed.  30 tablet  0  . naproxen sodium (ANAPROX) 220 MG tablet Take 220 mg by mouth as needed.        . traMADol (ULTRAM) 50 MG tablet Take 1 tablet (50 mg total) by mouth every 8 (eight) hours as needed for pain.  60 tablet  3   No facility-administered encounter medications on file as of 02/24/2013.   BP 130/82  Pulse 68  Temp(Src) 98.3 F (36.8 C) (Oral)  Wt 210 lb (95.255 kg)  BMI 33.39 kg/m2  SpO2 97%  Review of Systems  Constitutional: Negative for fever, chills, appetite change, fatigue and unexpected weight change.  HENT: Negative for congestion, ear pain, sinus pressure, sore throat, trouble swallowing and voice change.   Eyes: Negative for  visual disturbance.  Respiratory: Negative for cough, shortness of breath, wheezing and stridor.   Cardiovascular: Negative for chest pain, palpitations and leg swelling.  Gastrointestinal: Negative for nausea, vomiting, abdominal pain, diarrhea, constipation, blood in stool, abdominal distention and anal bleeding.  Genitourinary: Negative for dysuria and flank pain.  Musculoskeletal: Positive for back pain. Negative for arthralgias, gait problem, myalgias and neck pain.  Skin: Negative for color change and rash.  Neurological: Negative for dizziness and headaches.  Hematological: Negative for adenopathy. Does not bruise/bleed easily.  Psychiatric/Behavioral: Negative for suicidal ideas, sleep disturbance and dysphoric mood. The patient is not nervous/anxious.        Objective:   Physical Exam  Constitutional: She is oriented to person, place, and time. She appears well-developed and well-nourished. No distress.  HENT:  Head: Normocephalic and atraumatic.  Right Ear: External ear normal.  Left Ear: External ear normal.  Nose: Nose normal.  Mouth/Throat: Oropharynx is clear and moist. No oropharyngeal exudate.  Eyes: Conjunctivae are normal. Pupils are equal, round, and reactive to light. Right eye exhibits no discharge. Left eye exhibits no discharge. No scleral icterus.  Neck: Normal range of motion. Neck supple. No tracheal deviation present. No thyromegaly present.  Cardiovascular: Normal rate, regular rhythm, normal heart sounds and  intact distal pulses.  Exam reveals no gallop and no friction rub.   No murmur heard. Pulmonary/Chest: Effort normal and breath sounds normal. No accessory muscle usage. Not tachypneic. No respiratory distress. She has no decreased breath sounds. She has no wheezes. She has no rhonchi. She has no rales. She exhibits no tenderness.  Musculoskeletal: Normal range of motion. She exhibits no edema and no tenderness.  Lymphadenopathy:    She has no cervical  adenopathy.  Neurological: She is alert and oriented to person, place, and time. No cranial nerve deficit. She exhibits normal muscle tone. Coordination normal.  Skin: Skin is warm and dry. No rash noted. She is not diaphoretic. No erythema. No pallor.  Psychiatric: She has a normal mood and affect. Her behavior is normal. Judgment and thought content normal.          Assessment & Plan:

## 2013-02-24 NOTE — Addendum Note (Signed)
Addended by: Theola Sequin on: 02/24/2013 10:32 AM   Modules accepted: Orders

## 2013-05-27 ENCOUNTER — Other Ambulatory Visit: Payer: Self-pay | Admitting: Internal Medicine

## 2013-06-03 ENCOUNTER — Other Ambulatory Visit: Payer: Self-pay | Admitting: Internal Medicine

## 2013-06-09 ENCOUNTER — Other Ambulatory Visit: Payer: Self-pay | Admitting: Internal Medicine

## 2013-06-30 ENCOUNTER — Encounter: Payer: BC Managed Care – PPO | Admitting: Internal Medicine

## 2014-02-23 ENCOUNTER — Encounter: Payer: Self-pay | Admitting: Internal Medicine

## 2014-02-23 ENCOUNTER — Ambulatory Visit (INDEPENDENT_AMBULATORY_CARE_PROVIDER_SITE_OTHER): Payer: BC Managed Care – PPO | Admitting: Internal Medicine

## 2014-02-23 VITALS — BP 104/70 | HR 67 | Temp 98.0°F | Ht 66.5 in | Wt 202.5 lb

## 2014-02-23 DIAGNOSIS — J4520 Mild intermittent asthma, uncomplicated: Secondary | ICD-10-CM

## 2014-02-23 DIAGNOSIS — N182 Chronic kidney disease, stage 2 (mild): Secondary | ICD-10-CM

## 2014-02-23 DIAGNOSIS — Z1239 Encounter for other screening for malignant neoplasm of breast: Secondary | ICD-10-CM

## 2014-02-23 DIAGNOSIS — Z23 Encounter for immunization: Secondary | ICD-10-CM

## 2014-02-23 DIAGNOSIS — E039 Hypothyroidism, unspecified: Secondary | ICD-10-CM

## 2014-02-23 LAB — COMPREHENSIVE METABOLIC PANEL
ALBUMIN: 3.9 g/dL (ref 3.5–5.2)
ALT: 19 U/L (ref 0–35)
AST: 20 U/L (ref 0–37)
Alkaline Phosphatase: 86 U/L (ref 39–117)
BUN: 20 mg/dL (ref 6–23)
CO2: 27 mEq/L (ref 19–32)
Calcium: 9.3 mg/dL (ref 8.4–10.5)
Chloride: 102 mEq/L (ref 96–112)
Creatinine, Ser: 1.2 mg/dL (ref 0.4–1.2)
GFR: 51.07 mL/min — ABNORMAL LOW (ref >60.00)
Glucose, Bld: 92 mg/dL (ref 70–99)
POTASSIUM: 4.1 meq/L (ref 3.5–5.1)
SODIUM: 137 meq/L (ref 135–145)
Total Bilirubin: 0.7 mg/dL (ref 0.2–1.2)
Total Protein: 6.9 g/dL (ref 6.0–8.3)

## 2014-02-23 LAB — LIPID PANEL
CHOL/HDL RATIO: 3
Cholesterol: 223 mg/dL — ABNORMAL HIGH (ref 0–200)
HDL: 65 mg/dL (ref >39.00)
LDL CALC: 145 mg/dL — AB (ref 0–99)
NonHDL: 158
TRIGLYCERIDES: 64 mg/dL (ref 0.0–149.0)
VLDL: 12.8 mg/dL (ref 0.0–40.0)

## 2014-02-23 LAB — TSH: TSH: 1.36 u[IU]/mL (ref 0.35–4.50)

## 2014-02-23 LAB — MICROALBUMIN / CREATININE URINE RATIO
CREATININE, U: 203.3 mg/dL
Microalb Creat Ratio: 0.3 mg/g (ref 0.0–30.0)
Microalb, Ur: 0.6 mg/dL (ref 0.0–1.9)

## 2014-02-23 MED ORDER — FLUTICASONE-SALMETEROL 250-50 MCG/DOSE IN AEPB: 1.0000 | INHALATION_SPRAY | Freq: Two times a day (BID) | RESPIRATORY_TRACT | Status: DC

## 2014-02-23 MED ORDER — LEVOTHYROXINE SODIUM 50 MCG PO TABS: 50.0000 ug | ORAL_TABLET | Freq: Every day | ORAL | Status: DC

## 2014-02-23 NOTE — Assessment & Plan Note (Signed)
Symptoms well controlled with Advair. Will continue.

## 2014-02-23 NOTE — Assessment & Plan Note (Signed)
Renal function with labs today.  

## 2014-02-23 NOTE — Assessment & Plan Note (Signed)
Will check TSH with labs. Continue Levothyroxine. 

## 2014-02-23 NOTE — Progress Notes (Signed)
Subjective:    Patient ID: Elizabeth Rice, female    DOB: 09/02/1955, 58 y.o.   MRN: [id number]  HPI 58YO female presents for follow up.  Recently stopped taking Zyrtec. Doing well. No recent shortness of breath.  Home situation is better. She has moved to smaller house. Daughter has gone through drug rehab.  No concerns today.   Review of Systems  Constitutional: Negative for fever, chills, appetite change, fatigue and unexpected weight change.  HENT: Positive for hearing loss (chronic).   Eyes: Negative for visual disturbance.  Respiratory: Negative for shortness of breath.   Cardiovascular: Negative for chest pain and leg swelling.  Gastrointestinal: Negative for abdominal pain.  Skin: Negative for color change and rash.  Hematological: Negative for adenopathy. Does not bruise/bleed easily.  Psychiatric/Behavioral: Negative for dysphoric mood. The patient is not nervous/anxious.        Objective:    BP 104/70  Pulse 67  Temp(Src) 98 F (36.7 C) (Oral)  Ht 5' 6.5" (1.689 m)  Wt 202 lb 8 oz (91.853 kg)  BMI 32.20 kg/m2  SpO2 96% Physical Exam  Constitutional: She is oriented to person, place, and time. She appears well-developed and well-nourished. No distress.  HENT:  Head: Normocephalic and atraumatic.  Right Ear: External ear normal.  Left Ear: External ear normal.  Nose: Nose normal.  Mouth/Throat: Oropharynx is clear and moist. No oropharyngeal exudate.  Eyes: Conjunctivae are normal. Pupils are equal, round, and reactive to light. Right eye exhibits no discharge. Left eye exhibits no discharge. No scleral icterus.  Neck: Normal range of motion. Neck supple. No tracheal deviation present. No thyromegaly present.  Cardiovascular: Normal rate, regular rhythm, normal heart sounds and intact distal pulses.  Exam reveals no gallop and no friction rub.   No murmur heard. Pulmonary/Chest: Effort normal and breath sounds normal. No accessory muscle usage. Not  tachypneic. No respiratory distress. She has no decreased breath sounds. She has no wheezes. She has no rhonchi. She has no rales. She exhibits no tenderness.  Musculoskeletal: Normal range of motion. She exhibits no edema and no tenderness.  Lymphadenopathy:    She has no cervical adenopathy.  Neurological: She is alert and oriented to person, place, and time. No cranial nerve deficit. She exhibits normal muscle tone. Coordination normal.  Skin: Skin is warm and dry. No rash noted. She is not diaphoretic. No erythema. No pallor.  Psychiatric: She has a normal mood and affect. Her behavior is normal. Judgment and thought content normal.          Assessment & Plan:   Problem List Items Addressed This Visit     Unprioritized   Asthma     Symptoms well controlled with Advair. Will continue.    Relevant Medications      Fluticasone-Salmeterol (ADVAIR DISKUS) 250-50 MCG/DOSE AEPB   Chronic kidney disease (CKD), stage II (mild)     Renal function with labs today.    Relevant Orders      Comprehensive metabolic panel      Lipid panel      Microalbumin / creatinine urine ratio   Hypothyroidism - Primary     Will check TSH with labs. Continue Levothyroxine.    Relevant Medications      levothyroxine (SYNTHROID, LEVOTHROID) tablet   Other Relevant Orders      TSH    Other Visit Diagnoses   Screening for breast cancer        Relevant Orders  MM Digital Screening    Encounter for immunization            Return in about 6 months (around 08/25/2014) for Physical.

## 2014-02-23 NOTE — Patient Instructions (Signed)
Labs today.  Follow up in 3 months for physical. 

## 2014-02-23 NOTE — Progress Notes (Signed)
Pre visit review using our clinic review tool, if applicable. No additional management support is needed unless otherwise documented below in the visit note. 

## 2014-03-11 ENCOUNTER — Encounter: Payer: Self-pay | Admitting: Internal Medicine

## 2014-03-11 ENCOUNTER — Encounter: Payer: Self-pay | Admitting: *Deleted

## 2014-03-11 MED ORDER — ALBUTEROL SULFATE HFA 108 (90 BASE) MCG/ACT IN AERS: 2.0000 | INHALATION_SPRAY | Freq: Four times a day (QID) | RESPIRATORY_TRACT | Status: DC | PRN

## 2014-03-21 ENCOUNTER — Other Ambulatory Visit: Payer: Self-pay | Admitting: Internal Medicine

## 2014-04-16 ENCOUNTER — Telehealth: Payer: Self-pay | Admitting: Internal Medicine

## 2014-04-16 NOTE — Telephone Encounter (Signed)
Pt left msg requesting a appt for lump on left breast and it is hard. Please advise where to add to the schedule.msn

## 2014-04-19 ENCOUNTER — Telehealth: Payer: Self-pay

## 2014-04-19 NOTE — Telephone Encounter (Signed)
Per Dr Gilford Rile, please disregard the 3:30 - Dr Gilford Rile verbally changed to 1:00 - Please put pt on the schedule on thur at 1:00.  I already notified pt

## 2014-04-19 NOTE — Telephone Encounter (Signed)
The patient has been put on the scheduled for 12.10.15 @ 1:00.

## 2014-04-19 NOTE — Telephone Encounter (Signed)
Elizabeth Rice has worked her in Thursday at [employer]

## 2014-04-19 NOTE — Telephone Encounter (Signed)
The patient called and stated she has developed a large size lump in her breast.  She is hoping to be worked in asap for an appointment.  Do you want her worked in? If so, when Thanks!     Callback - [phone]

## 2014-04-19 NOTE — Telephone Encounter (Signed)
3:30pm on Thursday

## 2014-04-19 NOTE — Telephone Encounter (Signed)
Please see below.

## 2014-04-22 ENCOUNTER — Ambulatory Visit (INDEPENDENT_AMBULATORY_CARE_PROVIDER_SITE_OTHER): Payer: BC Managed Care – PPO | Admitting: Internal Medicine

## 2014-04-22 ENCOUNTER — Encounter: Payer: Self-pay | Admitting: Internal Medicine

## 2014-04-22 VITALS — BP 120/68 | HR 75 | Temp 98.1°F | Resp 16 | Ht 66.5 in | Wt 204.5 lb

## 2014-04-22 DIAGNOSIS — N63 Unspecified lump in unspecified breast: Secondary | ICD-10-CM | POA: Insufficient documentation

## 2014-04-22 NOTE — Assessment & Plan Note (Signed)
Irregular breast nodule in the right breast. Will set up mammogram and [place]. We discussed referral to general surgery, but will hold off on results of imaging, as biopsy may be performed at [facility]. She prefers referral to [facility] if needed.

## 2014-04-22 NOTE — Progress Notes (Signed)
   Subjective:    Patient ID: Elizabeth Rice, female    DOB: 07-May-1956, 58 y.o.   MRN: [id number]  HPI 58YO female presents for acute visit.  Developed lump last week. Seemed to develop overnight. Area was hot and painful with some purplish discoloration of skin. No known trauma to breast. Had subjective fever last week. Seems to be smaller now. Not as tender. No discharge from nipple. Nipple did appear retracted at first. Last mammogram  Past medical, surgical, family and social history per today's encounter.  Review of Systems  Constitutional: Positive for fever. Negative for chills, appetite change, fatigue and unexpected weight change.  Eyes: Negative for visual disturbance.  Respiratory: Negative for shortness of breath.   Cardiovascular: Positive for chest pain (at sight of breast mass). Negative for leg swelling.  Gastrointestinal: Negative for abdominal pain.  Skin: Negative for color change and rash.  Hematological: Negative for adenopathy. Does not bruise/bleed easily.  Psychiatric/Behavioral: Negative for dysphoric mood. The patient is not nervous/anxious.        Objective:    BP 120/68 mmHg  Pulse 75  Temp(Src) 98.1 F (36.7 C) (Oral)  Resp 16  Ht 5' 6.5" (1.689 m)  Wt 204 lb 8 oz (92.761 kg)  BMI 32.52 kg/m2  SpO2 97% Physical Exam  Constitutional: She is oriented to person, place, and time. She appears well-developed and well-nourished. No distress.  HENT:  Head: Normocephalic and atraumatic.  Right Ear: External ear normal.  Left Ear: External ear normal.  Nose: Nose normal.  Mouth/Throat: Oropharynx is clear and moist.  Eyes: Conjunctivae are normal. Pupils are equal, round, and reactive to light. Right eye exhibits no discharge. Left eye exhibits no discharge. No scleral icterus.  Neck: Normal range of motion. Neck supple. No tracheal deviation present. No thyromegaly present.  Pulmonary/Chest: Effort normal. No accessory muscle usage. No tachypnea. She has  no decreased breath sounds. She has no rhonchi.    Musculoskeletal: Normal range of motion. She exhibits no edema or tenderness.  Lymphadenopathy:    She has no cervical adenopathy.  Neurological: She is alert and oriented to person, place, and time. No cranial nerve deficit. She exhibits normal muscle tone. Coordination normal.  Skin: Skin is warm and dry. No rash noted. She is not diaphoretic. No erythema. No pallor.  Psychiatric: She has a normal mood and affect. Her behavior is normal. Judgment and thought content normal.          Assessment & Plan:   Problem List Items Addressed This Visit      Unprioritized   Breast nodule - Primary    Irregular breast nodule in the right breast. Will set up mammogram and [place]. We discussed referral to general surgery, but will hold off on results of imaging, as biopsy may be performed at [facility]. She prefers referral to [facility] if needed.    Relevant Orders      MM Digital Diagnostic Bilat      US BREAST LTD UNI RIGHT INC AXILLA       Return if symptoms worsen or fail to improve.

## 2014-04-22 NOTE — Patient Instructions (Signed)
We will set up a mammogram and ultrasound of your breast.  We will call you as soon as results are available.

## 2014-04-23 ENCOUNTER — Telehealth: Payer: Self-pay

## 2014-04-23 DIAGNOSIS — N63 Unspecified lump in unspecified breast: Secondary | ICD-10-CM

## 2014-04-23 LAB — HM MAMMOGRAPHY: HM Mammogram: NORMAL

## 2014-04-23 NOTE — Telephone Encounter (Signed)
Spoke to patient who stated that she had a ultrasound sound done and mammogram that were normal but lump still present. Told to follow up with PCP. Dr. Gilford Rile recommends patient to see a surgeon for a biopsy. Patient is agreeable to see the surgeon as recommended by Dr. Gilford Rile.

## 2014-04-23 NOTE — Addendum Note (Signed)
Addended by: Ronette Deter A on: 04/23/2014 04:51 PM   Modules accepted: Orders

## 2014-04-26 ENCOUNTER — Encounter: Payer: Self-pay | Admitting: *Deleted

## 2014-04-27 ENCOUNTER — Encounter: Payer: Self-pay | Admitting: Internal Medicine

## 2014-05-05 ENCOUNTER — Encounter: Payer: Self-pay | Admitting: General Surgery

## 2014-05-05 ENCOUNTER — Ambulatory Visit (INDEPENDENT_AMBULATORY_CARE_PROVIDER_SITE_OTHER): Payer: BC Managed Care – PPO | Admitting: General Surgery

## 2014-05-05 ENCOUNTER — Other Ambulatory Visit: Payer: BC Managed Care – PPO

## 2014-05-05 VITALS — Ht 66.5 in | Wt 204.0 lb

## 2014-05-05 DIAGNOSIS — N63 Unspecified lump in unspecified breast: Secondary | ICD-10-CM

## 2014-05-05 HISTORY — PX: BREAST BIOPSY: SHX20

## 2014-05-05 NOTE — Patient Instructions (Signed)

## 2014-05-05 NOTE — Progress Notes (Signed)
Patient ID: Elizabeth Rice, female   DOB: 1956/04/04, 58 y.o.   MRN: [id number]  Chief Complaint  Patient presents with  . Other    evaluation of left breast mass and purple discoloration of skin     HPI Elizabeth Rice is a 58 y.o. female.  who presents for a breast evaluation. The most recent mammogram was done on 04-23-14. She states after Thanksgiving she has a fever and had not been feeling good. She noticed at this time some right breast tenderness with fever to the touch and hardness. She states the area has softened. The area has a purple/pink coloration as well.  Patient does perform regular self breast checks and gets regular mammograms done.  She has a family history of breast cancer. She has no prior breast problems. No injuries to the breasts. There is no history of nipple areolar contact.  HPI  Past Medical History  Diagnosis Date  . Thyroid disease   . Hearing loss   . Chronic kidney disease   . Unspecified vitamin D deficiency   . Asthma   . DDD (degenerative disc disease)   . Barrett esophagus   . Hearing loss 2007  . Deafness     Past Surgical History  Procedure Laterality Date  . Cervical fusion    . Tonsillectomy  1986  . Cesarean section  1992, 2002    Family History  Problem Relation Age of Onset  . Cancer Mother 33    breast  . Migraines Daughter   . Cancer Maternal Aunt 25    breast    Social History History  Substance Use Topics  . Smoking status: Former Smoker    Quit date: 05/14/1986  . Smokeless tobacco: Never Used  . Alcohol Use: 0.0 oz/week    0 Not specified per week     Comment: occasionally    Allergies  Allergen Reactions  . Ivp Dye [Iodinated Diagnostic Agents] Anaphylaxis    Current Outpatient Prescriptions  Medication Sig Dispense Refill  . albuterol (VENTOLIN HFA) 108 (90 BASE) MCG/ACT inhaler Inhale 2 puffs into the lungs every 6 (six) hours as needed. 18 g 3  . cholecalciferol (VITAMIN D) 1000 UNITS tablet Take 2,000 Units  by mouth daily.      . Fluticasone-Salmeterol (ADVAIR DISKUS) 250-50 MCG/DOSE AEPB Inhale 1 puff into the lungs 2 (two) times daily. 180 each 1  . levothyroxine (SYNTHROID, LEVOTHROID) 50 MCG tablet TAKE 1 TABLET BY MOUTH EVERY DAY 90 tablet 2  . naproxen sodium (ANAPROX) 220 MG tablet Take 220 mg by mouth as needed.      Marland Kitchen omeprazole (PRILOSEC) 20 MG capsule Take two tablets in the morning and one tablet at night     No current facility-administered medications for this visit.    Review of Systems Review of Systems  Constitutional: Negative.   Respiratory: Negative.   Cardiovascular: Negative.     Height 5' 6.5" (1.689 m), weight 204 lb (92.534 kg).  Physical Exam Physical Exam  Constitutional: She is oriented to person, place, and time. She appears well-developed and well-nourished.  Neck: Neck supple. No thyromegaly present.  Pulmonary/Chest: Right breast exhibits mass (area of desquamation at 1 oclock 5 cm from nipple. 2 cm area of thickening). Right breast exhibits no inverted nipple, no nipple discharge, no skin change and no tenderness. Left breast exhibits no inverted nipple, no mass, no nipple discharge, no skin change and no tenderness.    Lymphadenopathy:    She has  no cervical adenopathy.    She has no axillary adenopathy.  Neurological: She is alert and oriented to person, place, and time.  Skin: Skin is warm and dry.    Data Reviewed Bilateral mammograms and right breast ultrasound completed at [facility] dated 04/23/2014 were reviewed. BI-RADS 1.  Clinical note that additional evaluation should be undertaken based on clinical history and reported clinical exam. These images were reviewed.  Ultrasound examination of the right breast in the upper inner quadrant was completed with special attention to the area of the near thickening in the 1:00 position. There are 2 distinct areas of abnormality on ultrasound. The first is 5 cm from the nipple measuring 1.1 x 2.1  x 2.2 cm. There is hyperechoic tissue with anechoic areas at the base of the area of focal thickening. Slightly increased vascular flow within the hyperechoic tissue is noted. Just 2 cm from this a irregular hypoechoic area with focal acoustic shadowing measuring 0.6 x 1.6 cm in maximum diameter is noted. This is a heterogeneous area with the suggestion of continuity with the more proximal (in relation to the nipple) nodule.  Considering the slight skin retraction noted in the history of erythema biopsy was recommended and acceptable to the patient.  10 mL of 0.5% Xylocaine with 0.25% Marcaine with 1-200,000 units epinephrine was utilized and well tolerated. Both areas could be approached from a single entry site on the medial aspect of the breast. The 7:00 area was biopsied first and a proximally 8 core samples making use of a 10-gauge Encor vacuum-assisted of eyes were obtained.  With a new receiver attached, the area to 5:00 position was sampled again with approximately 8 cores.  There was evidence of some bleeding at the 7 cm site, and this area was reaspirated and then direct pressure held for 5 minutes. Repeat imaging showed minimal residual hematoma. Biopsy clips were placed at both sites as it was not confirmed that this was all 1 process.  Skin defects were closed with benzoin and Steri-Strips followed by Telfa and Tegaderm dressing.  Assessment    Right breast mass, possible inflammatory, less likely malignant.    Plan    Written instructions right regarding postbiopsy wound care. The patient will be contacted by both phone and email due to her hearing disability as soon as results are available.     PCP:  Sonnie Alamo 05/07/2014, 3:30 PM

## 2014-05-06 ENCOUNTER — Telehealth: Payer: Self-pay | Admitting: General Surgery

## 2014-05-06 NOTE — Telephone Encounter (Signed)
Notified biopsy results were negative for cancer.  Email sent as well.

## 2014-05-07 DIAGNOSIS — N63 Unspecified lump in unspecified breast: Secondary | ICD-10-CM | POA: Insufficient documentation

## 2014-05-12 ENCOUNTER — Ambulatory Visit (INDEPENDENT_AMBULATORY_CARE_PROVIDER_SITE_OTHER): Payer: Self-pay | Admitting: *Deleted

## 2014-05-12 DIAGNOSIS — N63 Unspecified lump in unspecified breast: Secondary | ICD-10-CM

## 2014-05-12 NOTE — Patient Instructions (Signed)
Aware to use heating pad for comfort

## 2014-05-12 NOTE — Progress Notes (Signed)
Patient came in today for a wound check/post breast biopsy.  The wound is clean, with no signs of infection noted. Bruising noted. Follow up as scheduled.

## 2014-06-10 ENCOUNTER — Ambulatory Visit (INDEPENDENT_AMBULATORY_CARE_PROVIDER_SITE_OTHER): Payer: Self-pay | Admitting: General Surgery

## 2014-06-10 ENCOUNTER — Encounter: Payer: Self-pay | Admitting: General Surgery

## 2014-06-10 VITALS — BP 138/78 | HR 76 | Resp 14 | Ht 66.0 in | Wt 212.0 lb

## 2014-06-10 DIAGNOSIS — N63 Unspecified lump in unspecified breast: Secondary | ICD-10-CM

## 2014-06-10 DIAGNOSIS — N641 Fat necrosis of breast: Secondary | ICD-10-CM

## 2014-06-10 NOTE — Progress Notes (Signed)
Patient ID: Elizabeth Rice, female   DOB: Aug 22, 1955, 59 y.o.   MRN: [id number]  Chief Complaint  Patient presents with  . Follow-up    right breast biopsy    HPI Elizabeth Rice is a 59 y.o. female.  here today following up from a right breast biopsy done on 05/05/14. Patient states she is doing well. The patient was originally seen in December 2015 at which time she had noticed a mass in the breast associate was redness and swelling. Mammograms at that time were negative. Subsequent biopsy of 2 areas showed a ruptured cyst as well as a second area of fat necrosis.  The patient notices some mild residual thickening along the site of initial inflammation. He is otherwise pain free.  Marland KitchenHPI  Past Medical History  Diagnosis Date  . Thyroid disease   . Hearing loss   . Chronic kidney disease   . Unspecified vitamin D deficiency   . Asthma   . DDD (degenerative disc disease)   . Barrett esophagus   . Hearing loss 2007  . Deafness     Past Surgical History  Procedure Laterality Date  . Cervical fusion    . Tonsillectomy  1986  . Cesarean section  1992, 2002  . Breast biopsy Right 05/05/14    Family History  Problem Relation Age of Onset  . Cancer Mother 56    breast  . Migraines Daughter   . Cancer Maternal Aunt 26    breast    Social History History  Substance Use Topics  . Smoking status: Former Smoker    Quit date: 05/14/1986  . Smokeless tobacco: Never Used  . Alcohol Use: 0.0 oz/week    0 Not specified per week     Comment: occasionally    Allergies  Allergen Reactions  . Ivp Dye [Iodinated Diagnostic Agents] Anaphylaxis    Current Outpatient Prescriptions  Medication Sig Dispense Refill  . albuterol (VENTOLIN HFA) 108 (90 BASE) MCG/ACT inhaler Inhale 2 puffs into the lungs every 6 (six) hours as needed. 18 g 3  . cholecalciferol (VITAMIN D) 1000 UNITS tablet Take 2,000 Units by mouth daily.      . Fluticasone-Salmeterol (ADVAIR DISKUS) 250-50 MCG/DOSE AEPB  Inhale 1 puff into the lungs 2 (two) times daily. 180 each 1  . levothyroxine (SYNTHROID, LEVOTHROID) 50 MCG tablet TAKE 1 TABLET BY MOUTH EVERY DAY 90 tablet 2  . naproxen sodium (ANAPROX) 220 MG tablet Take 220 mg by mouth as needed.      Marland Kitchen omeprazole (PRILOSEC) 20 MG capsule Take two tablets in the morning and one tablet at night     No current facility-administered medications for this visit.    Review of Systems Review of Systems  Constitutional: Negative.   Respiratory: Negative.   Cardiovascular: Negative.     Blood pressure 138/78, pulse 76, resp. rate 14, height 5\' 6"  (1.676 m), weight 212 lb (96.163 kg).  Physical Exam Physical Exam  Constitutional: She is oriented to person, place, and time. She appears well-developed and well-nourished.  Eyes: Conjunctivae are normal. No scleral icterus.  Neck: Neck supple.  Cardiovascular: Normal rate, regular rhythm and normal heart sounds.   Pulmonary/Chest: Effort normal and breath sounds normal. Right breast exhibits no inverted nipple, no mass, no nipple discharge, no skin change and no tenderness.    Right breast incision looks clean and healing well, Slight thickening at 1 o'clock.  Abdominal: Soft. Bowel sounds are normal. There is no tenderness.  Lymphadenopathy:  She has no cervical adenopathy.  Neurological: She is alert and oriented to person, place, and time.  Skin: Skin is warm and dry.    Data Reviewed Breast, right, needle core biopsy, 1 o'clock, 5 cm from nipple - FAT NECROSIS. - THERE IS NO EVIDENCE OF MALIGNANCY.  Breast, right, needle core biopsy, 1 o'clock, 7 cm from nipple - FIBROCYSTIC CHANGES WITH USUAL DUCTAL HYPERPLASIA AND FEATURES OF CYST RUPTURE. - THERE IS NO EVIDENCE OF MALIGNANCY. Enid Cutter MD Pathologist, Electronic Signature (Case signed 05/06/2014) Specimen Gross and Clinical Information Assessment    Near complete resolution of previous inflammatory process involving the upper inner  quadrant of the right breast.    Plan    Patient to return in June 2016 for a right breast diagnotic mammogram to be completed at [facility].       PCP: Sonnie Alamo 06/10/2014, 11:12 AM

## 2014-06-10 NOTE — Patient Instructions (Signed)
Patient to return in June 2016 for a right breast diagnotic mammogram

## 2014-06-17 ENCOUNTER — Encounter: Payer: Self-pay | Admitting: Internal Medicine

## 2014-06-17 ENCOUNTER — Ambulatory Visit (INDEPENDENT_AMBULATORY_CARE_PROVIDER_SITE_OTHER): Payer: BLUE CROSS/BLUE SHIELD | Admitting: Internal Medicine

## 2014-06-17 VITALS — BP 105/69 | HR 78 | Temp 98.1°F | Ht 66.5 in | Wt 209.2 lb

## 2014-06-17 DIAGNOSIS — Z0000 Encounter for general adult medical examination without abnormal findings: Secondary | ICD-10-CM

## 2014-06-17 DIAGNOSIS — R5382 Chronic fatigue, unspecified: Secondary | ICD-10-CM

## 2014-06-17 DIAGNOSIS — E039 Hypothyroidism, unspecified: Secondary | ICD-10-CM

## 2014-06-17 LAB — COMPREHENSIVE METABOLIC PANEL
ALK PHOS: 92 U/L (ref 39–117)
ALT: 24 U/L (ref 0–35)
AST: 19 U/L (ref 0–37)
Albumin: 4.6 g/dL (ref 3.5–5.2)
BILIRUBIN TOTAL: 0.4 mg/dL (ref 0.2–1.2)
BUN: 21 mg/dL (ref 6–23)
CALCIUM: 9.8 mg/dL (ref 8.4–10.5)
CO2: 30 mEq/L (ref 19–32)
CREATININE: 1.12 mg/dL (ref 0.40–1.20)
Chloride: 102 mEq/L (ref 96–112)
GFR: 53.12 mL/min — AB (ref >60.00)
Glucose, Bld: 113 mg/dL — ABNORMAL HIGH (ref 70–99)
POTASSIUM: 4.6 meq/L (ref 3.5–5.1)
Sodium: 141 mEq/L (ref 135–145)
TOTAL PROTEIN: 6.8 g/dL (ref 6.0–8.3)

## 2014-06-17 LAB — CBC WITH DIFFERENTIAL/PLATELET
Basophils Absolute: 0.1 10*3/uL (ref 0.0–0.1)
Basophils Relative: 0.9 % (ref 0.0–3.0)
Eosinophils Absolute: 0.4 10*3/uL (ref 0.0–0.7)
Eosinophils Relative: 4.1 % (ref 0.0–5.0)
HCT: 43.6 % (ref 36.0–46.0)
Hemoglobin: 14.9 g/dL (ref 12.0–15.0)
LYMPHS ABS: 4.2 10*3/uL — AB (ref 0.7–4.0)
Lymphocytes Relative: 46.1 % — ABNORMAL HIGH (ref 12.0–46.0)
MCHC: 34.1 g/dL (ref 30.0–36.0)
MCV: 87.2 fl (ref 78.0–100.0)
MONOS PCT: 6.5 % (ref 3.0–12.0)
Monocytes Absolute: 0.6 10*3/uL (ref 0.1–1.0)
NEUTROS PCT: 42.4 % — AB (ref 43.0–77.0)
Neutro Abs: 3.9 10*3/uL (ref 1.4–7.7)
PLATELETS: 278 10*3/uL (ref 150.0–400.0)
RBC: 5 Mil/uL (ref 3.87–5.11)
RDW: 12.9 % (ref 11.5–15.5)
WBC: 9.1 10*3/uL (ref 4.0–10.5)

## 2014-06-17 LAB — LIPID PANEL
CHOL/HDL RATIO: 3
Cholesterol: 217 mg/dL — ABNORMAL HIGH (ref 0–200)
HDL: 79.5 mg/dL (ref >39.00)
LDL Cholesterol: 116 mg/dL — ABNORMAL HIGH (ref 0–99)
NonHDL: 137.5
Triglycerides: 109 mg/dL (ref 0.0–149.0)
VLDL: 21.8 mg/dL (ref 0.0–40.0)

## 2014-06-17 LAB — TSH: TSH: 2.06 u[IU]/mL (ref 0.35–4.50)

## 2014-06-17 LAB — VITAMIN B12: VITAMIN B 12: 373 pg/mL (ref 211–911)

## 2014-06-17 LAB — T4, FREE: Free T4: 0.69 ng/dL (ref 0.60–1.60)

## 2014-06-17 LAB — MICROALBUMIN / CREATININE URINE RATIO
CREATININE, U: 202.1 mg/dL
Microalb Creat Ratio: 0.6 mg/g (ref 0.0–30.0)
Microalb, Ur: 1.2 mg/dL (ref 0.0–1.9)

## 2014-06-17 LAB — HEMOGLOBIN A1C: Hgb A1c MFr Bld: 5.6 % (ref 4.6–6.5)

## 2014-06-17 LAB — VITAMIN D 25 HYDROXY (VIT D DEFICIENCY, FRACTURES): VITD: 64.59 ng/mL (ref 30.00–100.00)

## 2014-06-17 NOTE — Assessment & Plan Note (Signed)
Recent increased fatigue. Will check CBC, CMP, TSH, B12 with labs. Will set up sleep study. Follow up after results complete.

## 2014-06-17 NOTE — Patient Instructions (Signed)

## 2014-06-17 NOTE — Progress Notes (Signed)
Pre visit review using our clinic review tool, if applicable. No additional management support is needed unless otherwise documented below in the visit note. 

## 2014-06-17 NOTE — Progress Notes (Addendum)
Subjective:    Patient ID: Elizabeth Rice, female    DOB: 09/08/1955, 59 y.o.   MRN: [id number]  HPI  59YO female presents for annual exam.  Feels tired most of the time. No focal symptoms. Sleeping well, 8-10 hr per night. Brother recently diagnosed with sleep apnea. Questions if she may have this.  Aside from this, feeling well. Recently completed breast biopsy and results were benign.  Past medical, surgical, family and social history per today's encounter.  Review of Systems  Constitutional: Positive for fatigue. Negative for fever, chills, appetite change and unexpected weight change.  Eyes: Negative for visual disturbance.  Respiratory: Negative for shortness of breath.   Cardiovascular: Negative for chest pain and leg swelling.  Gastrointestinal: Negative for nausea, vomiting, abdominal pain, diarrhea and constipation.  Musculoskeletal: Negative for myalgias and arthralgias.  Skin: Negative for color change and rash.  Hematological: Negative for adenopathy. Does not bruise/bleed easily.  Psychiatric/Behavioral: Negative for suicidal ideas, sleep disturbance and dysphoric mood. The patient is not nervous/anxious.        Objective:    BP 105/69 mmHg  Pulse 78  Temp(Src) 98.1 F (36.7 C) (Oral)  Ht 5' 6.5" (1.689 m)  Wt 209 lb 4 oz (94.915 kg)  BMI 33.27 kg/m2  SpO2 97% Physical Exam  Constitutional: She is oriented to person, place, and time. She appears well-developed and well-nourished. No distress.  HENT:  Head: Normocephalic and atraumatic.  Right Ear: External ear normal.  Left Ear: External ear normal.  Nose: Nose normal.  Mouth/Throat: Oropharynx is clear and moist. No oropharyngeal exudate.  Eyes: Conjunctivae are normal. Pupils are equal, round, and reactive to light. Right eye exhibits no discharge. Left eye exhibits no discharge. No scleral icterus.  Neck: Normal range of motion. Neck supple. No tracheal deviation present. No thyromegaly present.    Cardiovascular: Normal rate, regular rhythm, normal heart sounds and intact distal pulses.  Exam reveals no gallop and no friction rub.   No murmur heard. Pulmonary/Chest: Effort normal and breath sounds normal. No accessory muscle usage. No tachypnea. No respiratory distress. She has no decreased breath sounds. She has no wheezes. She has no rhonchi. She has no rales. She exhibits no tenderness. Right breast exhibits mass. Right breast exhibits no inverted nipple, no nipple discharge, no skin change and no tenderness. Left breast exhibits no inverted nipple, no mass, no nipple discharge, no skin change and no tenderness. Breasts are symmetrical.    Abdominal: Soft. Bowel sounds are normal. She exhibits no distension and no mass. There is no tenderness. There is no rebound and no guarding.  Musculoskeletal: Normal range of motion. She exhibits no edema or tenderness.  Lymphadenopathy:    She has no cervical adenopathy.  Neurological: She is alert and oriented to person, place, and time. No cranial nerve deficit. She exhibits normal muscle tone. Coordination normal.  Skin: Skin is warm and dry. No rash noted. She is not diaphoretic. No erythema. No pallor.  Psychiatric: She has a normal mood and affect. Her behavior is normal. Judgment and thought content normal.          Assessment & Plan:   Problem List Items Addressed This Visit      Unprioritized   Chronic fatigue    Recent increased fatigue. Will check CBC, CMP, TSH, B12 with labs. Will set up sleep study. Follow up after results complete.      Relevant Orders   TSH   B12   Ambulatory referral  to Sleep Studies   Hypothyroidism    Will check TSH with labs.      Relevant Orders   T4, free   Routine general medical examination at a health care facility - Primary    General medical exam normal today including breast exam except as noted. Will check labs CBC, CMP, B12, TSH, lipids. Will set up sleep study given recent fatigue.  Mammogram UTD. Colonoscopy to be scheduled this March. PAP UTD 2014.      Relevant Orders   Hemoglobin A1c   TSH   CBC with Differential/Platelet   Comprehensive metabolic panel   Lipid panel   Microalbumin / creatinine urine ratio   Vit D  25 hydroxy (rtn osteoporosis monitoring)       Return in about 6 months (around 12/16/2014) for Recheck.

## 2014-06-17 NOTE — Assessment & Plan Note (Signed)
General medical exam normal today including breast exam except as noted. Will check labs CBC, CMP, B12, TSH, lipids. Will set up sleep study given recent fatigue. Mammogram UTD. Colonoscopy to be scheduled this March. PAP UTD 2014.

## 2014-06-17 NOTE — Assessment & Plan Note (Signed)
Will check TSH with labs. 

## 2014-06-17 NOTE — Addendum Note (Signed)
Addended by: Ronette Deter A on: 06/17/2014 09:08 AM   Modules accepted: Miquel Dunn

## 2014-09-09 ENCOUNTER — Other Ambulatory Visit: Payer: Self-pay

## 2014-09-09 DIAGNOSIS — N641 Fat necrosis of breast: Secondary | ICD-10-CM

## 2014-10-13 DIAGNOSIS — N6091 Unspecified benign mammary dysplasia of right breast: Secondary | ICD-10-CM

## 2014-10-13 HISTORY — DX: Unspecified benign mammary dysplasia of right breast: N60.91

## 2014-10-20 ENCOUNTER — Ambulatory Visit (INDEPENDENT_AMBULATORY_CARE_PROVIDER_SITE_OTHER): Payer: BLUE CROSS/BLUE SHIELD | Admitting: General Surgery

## 2014-10-20 ENCOUNTER — Encounter: Payer: Self-pay | Admitting: General Surgery

## 2014-10-20 VITALS — BP 142/84 | HR 86 | Resp 12 | Ht 66.5 in | Wt 204.0 lb

## 2014-10-20 DIAGNOSIS — N641 Fat necrosis of breast: Secondary | ICD-10-CM | POA: Diagnosis not present

## 2014-10-20 DIAGNOSIS — N63 Unspecified lump in unspecified breast: Secondary | ICD-10-CM

## 2014-10-20 NOTE — Progress Notes (Signed)
Patient ID: Elizabeth Rice, female   DOB: [birth year], 58 y.o.   MRN: [id number]  Chief Complaint  Patient presents with  . Follow-up    mammogram    HPI Elizabeth Rice is a 59 y.o. female.  who presents for a breast evaluation. The most recent mammogram was done on 10-15-14.  Patient does perform regular self breast checks and gets regular mammograms done.  She states the right breast has hurt worse since her mammogram.  HPI  Past Medical History  Diagnosis Date  . Thyroid disease   . Hearing loss   . Chronic kidney disease   . Unspecified vitamin D deficiency   . Asthma   . DDD (degenerative disc disease)   . Barrett esophagus   . Hearing loss 2007  . Deafness     Past Surgical History  Procedure Laterality Date  . Cervical fusion    . Tonsillectomy  1986  . Cesarean section  1992, 2002  . Breast biopsy Right 05/05/14    Family History  Problem Relation Age of Onset  . Cancer Mother 27    breast  . Migraines Daughter   . Cancer Maternal Aunt 46    breast    Social History History  Substance Use Topics  . Smoking status: Former Smoker    Quit date: 05/14/1986  . Smokeless tobacco: Never Used  . Alcohol Use: 0.0 oz/week    0 Standard drinks or equivalent per week     Comment: occasionally    Allergies  Allergen Reactions  . Ivp Dye [Iodinated Diagnostic Agents] Anaphylaxis  . Broccoli [Brassica Oleracea Italica] Diarrhea    Current Outpatient Prescriptions  Medication Sig Dispense Refill  . albuterol (VENTOLIN HFA) 108 (90 BASE) MCG/ACT inhaler Inhale 2 puffs into the lungs every 6 (six) hours as needed. 18 g 3  . cholecalciferol (VITAMIN D) 1000 UNITS tablet Take 2,000 Units by mouth daily.      . Fluticasone-Salmeterol (ADVAIR DISKUS) 250-50 MCG/DOSE AEPB Inhale 1 puff into the lungs 2 (two) times daily. 180 each 1  . levothyroxine (SYNTHROID, LEVOTHROID) 50 MCG tablet TAKE 1 TABLET BY MOUTH EVERY DAY 90 tablet 2  . naproxen sodium (ANAPROX) 220 MG tablet  Take 220 mg by mouth as needed.      Marland Kitchen omeprazole (PRILOSEC) 20 MG capsule Take two tablets in the morning and one tablet at night     No current facility-administered medications for this visit.    Review of Systems Review of Systems  Blood pressure 142/84, pulse 86, resp. rate 12, height 5' 6.5" (1.689 m), weight 204 lb (92.534 kg).  Physical Exam Physical Exam  Constitutional: She is oriented to person, place, and time. She appears well-developed and well-nourished.  Neck: Neck supple.  Cardiovascular: Normal rate, regular rhythm and normal heart sounds.   Pulmonary/Chest: Effort normal and breath sounds normal. Right breast exhibits tenderness. Right breast exhibits no inverted nipple, no mass, no nipple discharge and no skin change. Left breast exhibits no inverted nipple, no mass, no nipple discharge, no skin change and no tenderness.    Right breast tender laterally along muscle. Fullness right breast at 1 o'clock 12 CFN covering area of 3 cm.  Lymphadenopathy:    She has no cervical adenopathy.    She has no axillary adenopathy.  Neurological: She is alert and oriented to person, place, and time.  Skin: Skin is warm and dry.    Data Reviewed Right breast diagnostic mammogram dated 10/15/2014 completed  at [facility] was reviewed and compared to previous studies. BI-RADS-4.  Assessment    Fat necrosis right breast, palpable mass with minimal mammographic changes.    Plan    The films were reviewed and compared to previous studies, in particular prior to her 2015 biopsy.    Discussed risk and benefits of excision of the area of concern. The patient has experienced significant discomfort with her most recent mammogram, and the idea of additional screening studies to follow-up the mammographic abnormalities on appealing. With her family history of cancer including her mother and maternal aunt, the needle with negative biopsies 2 she is anxious about occult malignancy  being missed. In this situation it is very reasonable to proceed to excision of the area.  The patient is scheduled for surgery at [facility] on 11/04/14. She has requested to go to pre admit testing due to hearing problems and will see them on 10/28/14 at 11:30 am. Patient is aware of dates, time, and instructions.   PCP:  Sonnie Alamo 10/22/2014, 1:08 PM

## 2014-10-20 NOTE — Patient Instructions (Addendum)
The patient is aware to call back for any questions or concerns.  The patient is scheduled for surgery at [facility] on 11/04/14. She has requested to go to pre admit testing due to hearing problems and will see them on 10/28/14 at 11:30 am. Patient is aware of dates, time, and instructions.

## 2014-10-22 NOTE — H&P (Signed)
Patient ID: Elizabeth Rice, female DOB: Apr 10, 1956, 59 y.o. MRN: [id number]  Chief Complaint   Patient presents with   .  Follow-up     mammogram    HPI  Elizabeth Rice is a 59 y.o. female. who presents for a breast evaluation. The most recent mammogram was done on 10-15-14.  Patient does perform regular self breast checks and gets regular mammograms done. She states the right breast has hurt worse since her mammogram.  HPI  Past Medical History   Diagnosis  Date   .  Thyroid disease    .  Hearing loss    .  Chronic kidney disease    .  Unspecified vitamin D deficiency    .  Asthma    .  DDD (degenerative disc disease)    .  Barrett esophagus    .  Hearing loss  2007   .  Deafness     Past Surgical History   Procedure  Laterality  Date   .  Cervical fusion     .  Tonsillectomy   1986   .  Cesarean section   1992, 2002   .  Breast biopsy  Right  05/05/14    Family History   Problem  Relation  Age of Onset   .  Cancer  Mother  46     breast   .  Migraines  Daughter    .  Cancer  Maternal Aunt  92     breast    Social History  History   Substance Use Topics   .  Smoking status:  Former Smoker     Quit date:  05/14/1986   .  Smokeless tobacco:  Never Used   .  Alcohol Use:  0.0 oz/week     0 Standard drinks or equivalent per week      Comment: occasionally    Allergies   Allergen  Reactions   .  Ivp Dye [Iodinated Diagnostic Agents]  Anaphylaxis   .  Broccoli [Brassica Oleracea Italica]  Diarrhea    Current Outpatient Prescriptions   Medication  Sig  Dispense  Refill   .  albuterol (VENTOLIN HFA) 108 (90 BASE) MCG/ACT inhaler  Inhale 2 puffs into the lungs every 6 (six) hours as needed.  18 g  3   .  cholecalciferol (VITAMIN D) 1000 UNITS tablet  Take 2,000 Units by mouth daily.     .  Fluticasone-Salmeterol (ADVAIR DISKUS) 250-50 MCG/DOSE AEPB  Inhale 1 puff into the lungs 2 (two) times daily.  180 each  1   .  levothyroxine (SYNTHROID, LEVOTHROID) 50 MCG tablet  TAKE 1  TABLET BY MOUTH EVERY DAY  90 tablet  2   .  naproxen sodium (ANAPROX) 220 MG tablet  Take 220 mg by mouth as needed.     Marland Kitchen  omeprazole (PRILOSEC) 20 MG capsule  Take two tablets in the morning and one tablet at night      No current facility-administered medications for this visit.    Review of Systems  Review of Systems  Blood pressure 142/84, pulse 86, resp. rate 12, height 5' 6.5" (1.689 m), weight 204 lb (92.534 kg).  Physical Exam  Physical Exam  Constitutional: She is oriented to person, place, and time. She appears well-developed and well-nourished.  Neck: Neck supple.  Cardiovascular: Normal rate, regular rhythm and normal heart sounds.  Pulmonary/Chest: Effort normal and breath sounds normal. Right breast exhibits tenderness. Right breast exhibits  no inverted nipple, no mass, no nipple discharge and no skin change. Left breast exhibits no inverted nipple, no mass, no nipple discharge, no skin change and no tenderness.    Right breast tender laterally along muscle. Fullness right breast at 1 o'clock 12 CFN covering area of 3 cm.  Lymphadenopathy:  She has no cervical adenopathy.  She has no axillary adenopathy.  Neurological: She is alert and oriented to person, place, and time.  Skin: Skin is warm and dry.   Data Reviewed  Right breast diagnostic mammogram dated 10/15/2014 completed at [facility] was reviewed and compared to previous studies. BI-RADS-4.  Assessment   Fat necrosis right breast, palpable mass with minimal mammographic changes.   Plan   The films were reviewed and compared to previous studies, in particular prior to her 2015 biopsy.   Discussed risk and benefits of excision of the area of concern. The patient has experienced significant discomfort with her most recent mammogram, and the idea of additional screening studies to follow-up the mammographic abnormalities on appealing. With her family history of cancer including her mother and maternal aunt,  the needle with negative biopsies 2 she is anxious about occult malignancy being missed. In this situation it is very reasonable to proceed to excision of the area.  The patient is scheduled for surgery at [facility] on 11/04/14. She has requested to go to pre admit testing due to hearing problems and will see them on 10/28/14 at 11:30 am. Patient is aware of dates, time, and instructions.  PCP: Sonnie Alamo  10/22/2014, 1:08 PM

## 2014-10-28 ENCOUNTER — Other Ambulatory Visit: Payer: BLUE CROSS/BLUE SHIELD

## 2014-10-28 ENCOUNTER — Encounter
Admission: RE | Admit: 2014-10-28 | Discharge: 2014-10-28 | Disposition: A | Discharge status: Home / Self Care | Payer: BLUE CROSS/BLUE SHIELD | Source: Ambulatory Visit | Attending: General Surgery | Admitting: General Surgery

## 2014-10-28 DIAGNOSIS — N63 Unspecified lump in breast: Secondary | ICD-10-CM | POA: Insufficient documentation

## 2014-10-28 DIAGNOSIS — Z01812 Encounter for preprocedural laboratory examination: Secondary | ICD-10-CM | POA: Diagnosis not present

## 2014-10-28 HISTORY — DX: Hypothyroidism, unspecified: E03.9

## 2014-10-28 HISTORY — DX: Gastro-esophageal reflux disease without esophagitis: K21.9

## 2014-10-28 LAB — BASIC METABOLIC PANEL
Anion gap: 7 (ref 5–15)
BUN: 25 mg/dL — AB (ref 6–20)
CO2: 27 mmol/L (ref 22–32)
Calcium: 9.3 mg/dL (ref 8.9–10.3)
Chloride: 105 mmol/L (ref 101–111)
Creatinine, Ser: 1.07 mg/dL — ABNORMAL HIGH (ref 0.44–1.00)
GFR calc Af Amer: >60 mL/min (ref >60)
GFR, EST NON AFRICAN AMERICAN: 56 mL/min — AB (ref >60)
GLUCOSE: 97 mg/dL (ref 65–99)
POTASSIUM: 3.9 mmol/L (ref 3.5–5.1)
Sodium: 139 mmol/L (ref 135–145)

## 2014-10-28 NOTE — Patient Instructions (Addendum)
  Your procedure is scheduled on: Thursday 6/23 Report to Day Surgery. Medical mall Entrance To find out your arrival time please call [phone] between 1PM - 3PM on Wednesday 6/22.  Remember: Instructions that are not followed completely may result in serious medical risk, up to and including death, or upon the discretion of your surgeon and anesthesiologist your surgery may need to be rescheduled.    __x__ 1. Do not eat food or drink liquids after midnight. No gum chewing or hard candies.     __x__ 2. No Alcohol for 24 hours before or after surgery.   ____ 3. Bring all medications with you on the day of surgery if instructed.    __x__ 4. Notify your doctor if there is any change in your medical condition     (cold, fever, infections).     Do not wear jewelry, make-up, hairpins, clips or nail polish.  Do not wear lotions, powders, or perfumes. You may wear deodorant.  Do not shave 48 hours prior to surgery. Men may shave face and neck.  Do not bring valuables to the hospital.    [facility] is not responsible for any belongings or valuables.               Contacts, dentures or bridgework may not be worn into surgery.  Leave your suitcase in the car. After surgery it may be brought to your room.  For patients admitted to the hospital, discharge time is determined by your                treatment team.   Patients discharged the day of surgery will not be allowed to drive home.   Please read over the following fact sheets that you were given:   Surgical Site Infection Prevention   __x__ Take these medicines the morning of surgery with A SIP OF WATER:    1. levithyroxine  2. omeprazole  3. Use your inhalers Advair and Albuterol and bring Albuterol to hospital  4.  5.  6.  ____ Fleet Enema (as directed)   __x__ Use CHG Soap as directed  __x__ Use inhalers on the day of surgery  ____ Stop metformin 2 days prior to surgery    ____ Take 1/2 of usual insulin dose the night  before surgery and none on the morning of surgery.   ____ Stop Coumadin/Plavix/aspirin on   __x__ Stop Anti-inflammatories on    Stop Naproxen Today   ____ Stop supplements until after surgery.    ____ Bring C-Pap to the hospital.

## 2014-11-04 ENCOUNTER — Ambulatory Visit
Admission: RE | Admit: 2014-11-04 | Discharge: 2014-11-04 | Disposition: A | Discharge status: Home / Self Care | Payer: BLUE CROSS/BLUE SHIELD | Source: Ambulatory Visit | Attending: General Surgery | Admitting: General Surgery

## 2014-11-04 ENCOUNTER — Ambulatory Visit: Payer: BLUE CROSS/BLUE SHIELD | Admitting: Anesthesiology

## 2014-11-04 ENCOUNTER — Ambulatory Visit: Payer: BLUE CROSS/BLUE SHIELD

## 2014-11-04 ENCOUNTER — Encounter
Admission: RE | Disposition: A | Discharge status: Home / Self Care | Payer: Self-pay | Source: Ambulatory Visit | Attending: General Surgery

## 2014-11-04 ENCOUNTER — Encounter: Payer: Self-pay | Admitting: *Deleted

## 2014-11-04 DIAGNOSIS — E079 Disorder of thyroid, unspecified: Secondary | ICD-10-CM | POA: Insufficient documentation

## 2014-11-04 DIAGNOSIS — E559 Vitamin D deficiency, unspecified: Secondary | ICD-10-CM | POA: Diagnosis not present

## 2014-11-04 DIAGNOSIS — Z87891 Personal history of nicotine dependence: Secondary | ICD-10-CM | POA: Diagnosis not present

## 2014-11-04 DIAGNOSIS — N6011 Diffuse cystic mastopathy of right breast: Secondary | ICD-10-CM | POA: Diagnosis not present

## 2014-11-04 DIAGNOSIS — N6091 Unspecified benign mammary dysplasia of right breast: Secondary | ICD-10-CM | POA: Diagnosis not present

## 2014-11-04 DIAGNOSIS — Z9889 Other specified postprocedural states: Secondary | ICD-10-CM | POA: Insufficient documentation

## 2014-11-04 DIAGNOSIS — Z79899 Other long term (current) drug therapy: Secondary | ICD-10-CM | POA: Insufficient documentation

## 2014-11-04 DIAGNOSIS — Z8489 Family history of other specified conditions: Secondary | ICD-10-CM | POA: Insufficient documentation

## 2014-11-04 DIAGNOSIS — R928 Other abnormal and inconclusive findings on diagnostic imaging of breast: Secondary | ICD-10-CM

## 2014-11-04 DIAGNOSIS — Z981 Arthrodesis status: Secondary | ICD-10-CM | POA: Diagnosis not present

## 2014-11-04 DIAGNOSIS — J45909 Unspecified asthma, uncomplicated: Secondary | ICD-10-CM | POA: Diagnosis not present

## 2014-11-04 DIAGNOSIS — N641 Fat necrosis of breast: Secondary | ICD-10-CM | POA: Diagnosis present

## 2014-11-04 DIAGNOSIS — Z91018 Allergy to other foods: Secondary | ICD-10-CM | POA: Diagnosis not present

## 2014-11-04 DIAGNOSIS — H9190 Unspecified hearing loss, unspecified ear: Secondary | ICD-10-CM | POA: Diagnosis not present

## 2014-11-04 DIAGNOSIS — Z803 Family history of malignant neoplasm of breast: Secondary | ICD-10-CM | POA: Diagnosis not present

## 2014-11-04 DIAGNOSIS — Z7951 Long term (current) use of inhaled steroids: Secondary | ICD-10-CM | POA: Insufficient documentation

## 2014-11-04 DIAGNOSIS — Z91041 Radiographic dye allergy status: Secondary | ICD-10-CM | POA: Diagnosis not present

## 2014-11-04 DIAGNOSIS — N189 Chronic kidney disease, unspecified: Secondary | ICD-10-CM | POA: Diagnosis not present

## 2014-11-04 HISTORY — PX: BREAST BIOPSY: SHX20

## 2014-11-04 HISTORY — PX: BREAST EXCISIONAL BIOPSY: SUR124

## 2014-11-04 SURGERY — BREAST BIOPSY
Anesthesia: General | Laterality: Right | Wound class: Clean

## 2014-11-04 MED ORDER — ACETAMINOPHEN 10 MG/ML IV SOLN
INTRAVENOUS | Status: AC
Start: 2014-11-04 — End: 2014-11-04
  Filled 2014-11-04: qty 100

## 2014-11-04 MED ORDER — LACTATED RINGERS IV SOLN
INTRAVENOUS | Status: DC
  Administered 2014-11-04: 07:00:00 via INTRAVENOUS

## 2014-11-04 MED ORDER — PROPOFOL 10 MG/ML IV BOLUS
INTRAVENOUS | Status: DC | PRN
  Administered 2014-11-04 (×2): 14 mg via INTRAVENOUS
  Administered 2014-11-04: 28 mg via INTRAVENOUS
  Administered 2014-11-04: 14 mg via INTRAVENOUS

## 2014-11-04 MED ORDER — BUPIVACAINE HCL (PF) 0.5 % IJ SOLN
INTRAMUSCULAR | Status: AC
  Filled 2014-11-04: qty 30

## 2014-11-04 MED ORDER — PROPOFOL INFUSION 10 MG/ML OPTIME
INTRAVENOUS | Status: DC | PRN
  Administered 2014-11-04: 100 ug/kg/min via INTRAVENOUS

## 2014-11-04 MED ORDER — HYDROCODONE-ACETAMINOPHEN 5-325 MG PO TABS: 1.0000 | ORAL_TABLET | ORAL | Status: DC | PRN

## 2014-11-04 MED ORDER — SODIUM CHLORIDE 0.9 % IV SOLN
250.0000 mg | INTRAVENOUS | Status: DC | PRN
  Administered 2014-11-04: 10 ug/kg/min via INTRAVENOUS

## 2014-11-04 MED ORDER — ONDANSETRON HCL 4 MG/2ML IJ SOLN: 4.0000 mg | Freq: Once | INTRAMUSCULAR | Status: DC | PRN

## 2014-11-04 MED ORDER — ACETAMINOPHEN 10 MG/ML IV SOLN
INTRAVENOUS | Status: DC | PRN
  Administered 2014-11-04: 1000 mg via INTRAVENOUS

## 2014-11-04 MED ORDER — LIDOCAINE HCL (PF) 1 % IJ SOLN
INTRAMUSCULAR | Status: AC
  Filled 2014-11-04: qty 30

## 2014-11-04 MED ORDER — BUPIVACAINE-EPINEPHRINE (PF) 0.5% -1:200000 IJ SOLN
INTRAMUSCULAR | Status: DC | PRN
  Administered 2014-11-04: 20 mL

## 2014-11-04 MED ORDER — LIDOCAINE HCL (PF) 1 % IJ SOLN
INTRAMUSCULAR | Status: DC | PRN
Start: 2014-11-04 — End: 2014-11-04
  Administered 2014-11-04: 20 mL via SUBCUTANEOUS

## 2014-11-04 MED ORDER — BUPIVACAINE-EPINEPHRINE (PF) 0.5% -1:200000 IJ SOLN
INTRAMUSCULAR | Status: AC
  Filled 2014-11-04: qty 30

## 2014-11-04 MED ORDER — KETAMINE HCL 50 MG/ML IJ SOLN
INTRAMUSCULAR | Status: DC | PRN
  Administered 2014-11-04 (×3): 1.4 mg via INTRAMUSCULAR
  Administered 2014-11-04: 2.8 mg via INTRAMUSCULAR

## 2014-11-04 MED ORDER — FENTANYL CITRATE (PF) 100 MCG/2ML IJ SOLN: 25.0000 ug | INTRAMUSCULAR | Status: DC | PRN

## 2014-11-04 MED ORDER — MIDAZOLAM HCL 5 MG/5ML IJ SOLN
INTRAMUSCULAR | Status: DC | PRN
  Administered 2014-11-04: 2 mg via INTRAVENOUS

## 2014-11-04 SURGICAL SUPPLY — 42 items
BANDAGE ELASTIC 6 CLIP NS LF (GAUZE/BANDAGES/DRESSINGS) ×3 IMPLANT
BENZOIN TINCTURE PRP APPL 2/3 (GAUZE/BANDAGES/DRESSINGS) ×3 IMPLANT
BLADE SURG 15 STRL SS SAFETY (BLADE) ×6 IMPLANT
BNDG GAUZE 4.5X4.1 6PLY STRL (MISCELLANEOUS) ×3 IMPLANT
CANISTER SUCT 1200ML W/VALVE (MISCELLANEOUS) ×3 IMPLANT
CHLORAPREP W/TINT 26ML (MISCELLANEOUS) ×3 IMPLANT
CLIP TI WIDE RED SMALL 6 (CLIP) ×3 IMPLANT
CLOSURE WOUND 1/2 X4 (GAUZE/BANDAGES/DRESSINGS) ×1
CNTNR SPEC 2.5X3XGRAD LEK (MISCELLANEOUS)
CONT SPEC 4OZ STER OR WHT (MISCELLANEOUS)
CONTAINER SPEC 2.5X3XGRAD LEK (MISCELLANEOUS) IMPLANT
COVER PROBE FLX POLY STRL (MISCELLANEOUS) ×3 IMPLANT
DEVICE DUBIN SPECIMEN MAMMOGRA (MISCELLANEOUS) ×3 IMPLANT
DRAPE LAPAROTOMY 100X77 ABD (DRAPES) ×3 IMPLANT
DRESSING TELFA 4X3 1S ST N-ADH (GAUZE/BANDAGES/DRESSINGS) ×3 IMPLANT
DRSG TELFA 3X8 NADH (GAUZE/BANDAGES/DRESSINGS) ×3 IMPLANT
ELECT CAUTERY BLADE TIP 2.5 (TIP) ×3
ELECTRODE CAUTERY BLDE TIP 2.5 (TIP) ×1 IMPLANT
GAUZE FLUFF 18X24 1PLY STRL (GAUZE/BANDAGES/DRESSINGS) ×3 IMPLANT
GLOVE BIO SURGEON STRL SZ7.5 (GLOVE) ×6 IMPLANT
GLOVE INDICATOR 8.0 STRL GRN (GLOVE) ×6 IMPLANT
GOWN STRL REUS W/ TWL LRG LVL3 (GOWN DISPOSABLE) ×2 IMPLANT
GOWN STRL REUS W/TWL LRG LVL3 (GOWN DISPOSABLE) ×4
HARMONIC SCALPEL FOCUS (MISCELLANEOUS) IMPLANT
KIT RM TURNOVER STRD PROC AR (KITS) ×3 IMPLANT
LABEL OR SOLS (LABEL) IMPLANT
MARGIN MAP 10MM (MISCELLANEOUS) ×3 IMPLANT
NDL SAFETY 22GX1.5 (NEEDLE) ×3 IMPLANT
NDL SAFETY 25GX1.5 (NEEDLE) ×3 IMPLANT
PACK BASIN MINOR ARMC (MISCELLANEOUS) ×3 IMPLANT
PAD GROUND ADULT SPLIT (MISCELLANEOUS) ×3 IMPLANT
STRIP CLOSURE SKIN 1/2X4 (GAUZE/BANDAGES/DRESSINGS) ×2 IMPLANT
SUT ETHILON 3-0 FS-10 30 BLK (SUTURE) ×3
SUT VIC AB 2-0 CT1 (SUTURE) ×3 IMPLANT
SUT VIC AB 2-0 CT1 27 (SUTURE) ×4
SUT VIC AB 2-0 CT1 TAPERPNT 27 (SUTURE) ×2 IMPLANT
SUT VIC AB 4-0 FS2 27 (SUTURE) ×3 IMPLANT
SUTURE EHLN 3-0 FS-10 30 BLK (SUTURE) ×1 IMPLANT
SWABSTK COMLB BENZOIN TINCTURE (MISCELLANEOUS) ×3 IMPLANT
SYR CONTROL 10ML (SYRINGE) ×3 IMPLANT
TAPE TRANSPORE STRL 2 31045 (GAUZE/BANDAGES/DRESSINGS) IMPLANT
WATER STERILE IRR 1000ML POUR (IV SOLUTION) ×3 IMPLANT

## 2014-11-04 NOTE — OR Nursing (Signed)
Dr. Byrnett visited 

## 2014-11-04 NOTE — Transfer of Care (Signed)
Immediate Anesthesia Transfer of Care Note  Patient: Elizabeth Rice  Procedure(s) Performed: Procedure(s): BREAST BIOPSY (Right)  Patient Location: PACU  Anesthesia Type:General  Level of Consciousness: awake, alert , oriented and patient cooperative  Airway & Oxygen Therapy: Patient Spontanous Breathing and Patient connected to nasal cannula oxygen  Post-op Assessment: Report given to RN and Post -op Vital signs reviewed and stable  Post vital signs: Reviewed and stable  Last Vitals:  Filed Vitals:   11/04/14 0619  BP: 133/77  Pulse: 78  Temp: 36.8 C  Resp: 16    Complications: No apparent anesthesia complications

## 2014-11-04 NOTE — Discharge Instructions (Signed)
No driving until pain free.  Avoid strenuous activity with your arms for the next week. (Lifting, pulling, etc).   General Anesthesia, Care After Refer to this sheet in the next few weeks. These instructions provide you with information on caring for yourself after your procedure. Your health care provider may also give you more specific instructions. Your treatment has been planned according to current medical practices, but problems sometimes occur. Call your health care provider if you have any problems or questions after your procedure. WHAT TO EXPECT AFTER THE PROCEDURE After the procedure, it is typical to experience:  Sleepiness.  Nausea and vomiting. HOME CARE INSTRUCTIONS  For the first 24 hours after general anesthesia:  Have a responsible person with you.  Do not drive a car. If you are alone, do not take public transportation.  Do not drink alcohol.  Do not take medicine that has not been prescribed by your health care provider.  Do not sign important papers or make important decisions.  You may resume a normal diet and activities as directed by your health care provider.  Change bandages (dressings) as directed.  If you have questions or problems that seem related to general anesthesia, call the hospital and ask for the anesthetist or anesthesiologist on call. SEEK MEDICAL CARE IF:  You have nausea and vomiting that continue the day after anesthesia.  You develop a rash. SEEK IMMEDIATE MEDICAL CARE IF:   You have difficulty breathing.  You have chest pain.  You have any allergic problems. Document Released: 08/06/2000 Document Revised: 05/05/2013 Document Reviewed: 11/13/2012 [facility] Patient Information 2015 [place], [place]. This information is not intended to replace advice given to you by your health care provider. Make sure you discuss any questions you have with your health care provider.

## 2014-11-04 NOTE — H&P (Signed)
For repeat biopsy of the upper inner area of the right breast. Previous biopsies were notable for cyst rupture and fat necrosis.  No change in cardiopulmonary history or exam.

## 2014-11-04 NOTE — Anesthesia Preprocedure Evaluation (Addendum)
Anesthesia Evaluation  Patient identified by MRN, date of birth, ID band Patient awake    Reviewed: Allergy & Precautions, NPO status , Patient's Chart, lab work & pertinent test results  History of Anesthesia Complications (+) POST - OP SPINAL HEADACHE  Airway Mallampati: II  TM Distance: >3 FB Neck ROM: Limited    Dental  (+) Chipped, Caps   Pulmonary asthma , former smoker,    Pulmonary exam normal       Cardiovascular negative cardio ROS Normal cardiovascular exam    Neuro/Psych  Neuromuscular disease    GI/Hepatic GERD-  ,  Endo/Other  Hypothyroidism   Renal/GU Renal InsufficiencyRenal disease  negative genitourinary   Musculoskeletal  (+) Arthritis -, Osteoarthritis,    Abdominal Normal abdominal exam  (+)   Peds negative pediatric ROS (+)  Hematology   Anesthesia Other Findings   Reproductive/Obstetrics                         Anesthesia Physical Anesthesia Plan  ASA: III  Anesthesia Plan: General   Post-op Pain Management:    Induction: Intravenous  Airway Management Planned: Nasal Cannula  Additional Equipment:   Intra-op Plan:   Post-operative Plan:   Informed Consent: I have reviewed the patients History and Physical, chart, labs and discussed the procedure including the risks, benefits and alternatives for the proposed anesthesia with the patient or authorized representative who has indicated his/her understanding and acceptance.   Dental advisory given  Plan Discussed with: CRNA and Surgeon  Anesthesia Plan Comments:        Anesthesia Quick Evaluation

## 2014-11-04 NOTE — Anesthesia Postprocedure Evaluation (Signed)
  Anesthesia Post-op Note  Patient: Elizabeth Rice  Procedure(s) Performed: Procedure(s): BREAST BIOPSY (Right)  Anesthesia type:General  Patient location: PACU  Post pain: Pain level controlled  Post assessment: Post-op Vital signs reviewed, Patient's Cardiovascular Status Stable, Respiratory Function Stable, Patent Airway and No signs of Nausea or vomiting  Post vital signs: Reviewed and stable  Last Vitals:  Filed Vitals:   11/04/14 0830  BP: 119/72  Pulse: 90  Temp:   Resp: 18    Level of consciousness: awake, alert  and patient cooperative  Complications: No apparent anesthesia complications

## 2014-11-04 NOTE — Op Note (Signed)
Preoperative diagnosis: Fat necrosis right breast. Abnormal mammogram  Postoperative diagnosis: Same.  Procedure: Right breast wide excision with ultrasound-guided.  Operative surgeon: Ollen Bowl, M.D.  Anesthesia: Monitored anesthesia care, 40 mL 0.5% Xylocaine with 0.25% Marcaine with 1-200,000 epinephrine.  Estimated blood loss: 10 mL.  Clinical note this 59 year old was seen in November 2015 with inflammatory process involving the right breast. Subsequent core biopsy showed evidence of an inflamed cyst and fat necrosis. Recent follow-up mammogram showed persistent distortion which was of concern to the radiologist. It was elected to proceed to excision of the area.  Operative note: The patient underwent sedation and tolerated this well. The breast was prepped with chlor prep and drape. Ultrasound was used to identify the area in the 1:00 position of the right breast 5 and 7 cm from the nipple at the site of previous biopsy. A radial incision was made to encompass this area. The process had been noted to be fairly superficial. Just below the adipose tissue the block specimen measuring 5 x 6 x 7 cm was excised, orientated and sent for specimen radiograph. Postsurgical changes were noted in the resected tissue. No definitive mass. Specimen radiograph confirmed one of 2 previously placed clips.  The wound was approximated layers with interrupted 2-0 Vicryls figure-of-eight sutures. 2 small vascular clips were placed radially orientated to mark the site of new biopsy. The deep dermal adipose tissue was approximated with interrupted 2-0 Vicryls figure-of-eight sutures. The skin was closed with a running 4-0 Vicryls subcutaneous suture. Benzoin and Steri-Strips followed by Telfa pad, fluff gauze, Kerlix and an Ace wrap was applied.  The patient tolerated the procedure well and was taken recovery in stable condition.

## 2014-11-08 LAB — SURGICAL PATHOLOGY

## 2014-11-10 ENCOUNTER — Telehealth: Payer: Self-pay | Admitting: *Deleted

## 2014-11-10 NOTE — Telephone Encounter (Signed)
-----   Message from Robert Bellow, MD sent at 11/10/2014  7:53 AM EDT ----- Please notify pathology was benign. Will discuss details at follow-up.  ----- Message -----    From: [facility]    Sent: 11/08/2014   9:05 AM      To: Robert Bellow, MD

## 2014-11-10 NOTE — Telephone Encounter (Signed)
Notified patient as instructed, patient pleased. Discussed follow-up appointments, patient agrees  

## 2014-11-11 ENCOUNTER — Ambulatory Visit (INDEPENDENT_AMBULATORY_CARE_PROVIDER_SITE_OTHER): Payer: BLUE CROSS/BLUE SHIELD | Admitting: General Surgery

## 2014-11-11 ENCOUNTER — Encounter: Payer: Self-pay | Admitting: General Surgery

## 2014-11-11 VITALS — BP 140/88 | HR 78 | Resp 14 | Ht 66.0 in | Wt 202.0 lb

## 2014-11-11 DIAGNOSIS — N6091 Unspecified benign mammary dysplasia of right breast: Secondary | ICD-10-CM

## 2014-11-11 DIAGNOSIS — N62 Hypertrophy of breast: Secondary | ICD-10-CM

## 2014-11-11 NOTE — Progress Notes (Signed)
Patient ID: Elizabeth Rice, female   DOB: 02-14-1956, 59 y.o.   MRN: [id number]  Chief Complaint  Patient presents with  . Routine Post Op    right breast wide excision    HPI Elizabeth Rice is a 59 y.o. female here today for her post op right breast wide excision done on 11/04/14. Patient states she is doing well.  HPI  Past Medical History  Diagnosis Date  . Thyroid disease   . Hearing loss   . Chronic kidney disease   . Unspecified vitamin D deficiency   . Asthma   . DDD (degenerative disc disease)   . Barrett esophagus   . Hearing loss 2007  . Deafness   . Hypothyroidism   . GERD (gastroesophageal reflux disease)   . 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 Cervical fusion    . Tonsillectomy  1986  . Cesarean section  1992, 2002  . Breast biopsy Right 05/05/14  . Breast biopsy Right 11/04/2014    Procedure: BREAST BIOPSY;  Surgeon: Robert Bellow, MD;  Location: [facility] ORS;  Service: General;  Laterality: Right;    Family History  Problem Relation Age of Onset  . Cancer Mother 26    breast  . Migraines Daughter   . Cancer Maternal Aunt 15    breast    Social History History  Substance Use Topics  . Smoking status: Former Smoker    Quit date: 05/14/1986  . Smokeless tobacco: Never Used  . Alcohol Use: 0.0 oz/week    0 Standard drinks or equivalent per week     Comment: occasionally    Allergies  Allergen Reactions  . Ivp Dye [Iodinated Diagnostic Agents] Anaphylaxis  . Broccoli [Brassica Oleracea Italica] Diarrhea    Current Outpatient Prescriptions  Medication Sig Dispense Refill  . albuterol (VENTOLIN HFA) 108 (90 BASE) MCG/ACT inhaler Inhale 2 puffs into the lungs every 6 (six) hours as needed. (Patient taking differently: Inhale 2 puffs into the lungs every 6 (six) hours as needed for wheezing or shortness of breath. ) 18 g 3  .  cetirizine (ZYRTEC) 10 MG tablet Take 10 mg by mouth daily as needed for allergies.    . Cholecalciferol (VITAMIN D) 2000 UNITS CAPS Take 2,000 Units by mouth daily.    . Fluticasone-Salmeterol (ADVAIR DISKUS) 250-50 MCG/DOSE AEPB Inhale 1 puff into the lungs 2 (two) times daily. 180 each 1  . levothyroxine (SYNTHROID, LEVOTHROID) 50 MCG tablet TAKE 1 TABLET BY MOUTH EVERY DAY 90 tablet 2  . naproxen sodium (ANAPROX) 220 MG tablet Take 220 mg by mouth as needed (pain).     Marland Kitchen omeprazole (PRILOSEC) 20 MG capsule Take 20-40 mg by mouth 2 (two) times daily. Take two caps in the morning and one cap at night     No current facility-administered medications for this visit.    Review of Systems Review of Systems  Constitutional: Negative.   Respiratory: Negative.   Cardiovascular: Negative.     Blood pressure 140/88, pulse 78, resp. rate 14, height 5\' 6"  (1.676 m), weight 202 lb (91.627 kg).  Physical Exam Physical Exam  Constitutional: She is oriented to person, place, and time. She appears well-developed and well-nourished.  Cardiovascular: Normal rate, regular rhythm and normal heart sounds.   Pulmonary/Chest: Effort normal and breath sounds normal.    Neurological: She is  alert and oriented to person, place, and time.  Skin: Skin is warm and dry.    Data Reviewed  Surgical Pathology  CASE: [id number]  PATIENT: Elizabeth Rice  Surgical Pathology Report      SPECIMEN SUBMITTED:  A. Breast, right, wide excision   CLINICAL HISTORY:  None provided   PRE-OPERATIVE DIAGNOSIS:  Abnormal mammogram right breast   POST-OPERATIVE DIAGNOSIS:  Same      DIAGNOSIS:  A. RIGHT BREAST; WIDE EXCISION:  - FOCAL FLAT EPITHELIAL ATYPIA.  - FIBROCYSTIC CHANGES.  - BIOPSY SITE CHANGES, MARKER CLIP PRESENT.   Note  Fibrocystic changes are noted throughout the specimen and include  columnar cell change (with focal atypia as noted above), apocrine  metaplasia, and usual ductal  hyperplasia.              Assessment    Focal atypia. Doing well status post wide excision.    Plan    The patient was surprised her risk of breast cancer was as low as the [facility] predicts.  We had a long discussion regarding the use of chemoprevention. Tamoxifen is still the standard, 1% risk of DVT/PE, 1% risk of uterine cancer, significantly higher risk of vasomotor symptoms. Anticipated 50% reduction in the risk of breast cancer.  The patient's does think about it but she did asked to the prescription be sent in.  We'll plan on a follow-up exam in 1 month and reassess her clinical exam at that time.     PCP:  Sonnie Alamo 11/12/2014, 7:10 PM

## 2014-11-11 NOTE — Patient Instructions (Signed)
Patient to return in one month. 

## 2014-11-12 ENCOUNTER — Encounter: Payer: Self-pay | Admitting: General Surgery

## 2014-11-12 DIAGNOSIS — N6091 Unspecified benign mammary dysplasia of right breast: Secondary | ICD-10-CM | POA: Insufficient documentation

## 2014-12-08 ENCOUNTER — Encounter: Payer: Self-pay | Admitting: General Surgery

## 2014-12-08 ENCOUNTER — Ambulatory Visit (INDEPENDENT_AMBULATORY_CARE_PROVIDER_SITE_OTHER): Payer: BLUE CROSS/BLUE SHIELD | Admitting: General Surgery

## 2014-12-08 VITALS — BP 144/88 | HR 84 | Resp 14 | Ht 66.0 in | Wt 202.0 lb

## 2014-12-08 DIAGNOSIS — N62 Hypertrophy of breast: Secondary | ICD-10-CM

## 2014-12-08 DIAGNOSIS — N6091 Unspecified benign mammary dysplasia of right breast: Secondary | ICD-10-CM

## 2014-12-08 NOTE — Progress Notes (Signed)
Patient ID: Elizabeth Rice, female   DOB: 1956-04-28, 59 y.o.   MRN: [id number]  Chief Complaint  Patient presents with  . Follow-up    HPI Elizabeth Rice is a 59 y.o. female today for her one month right breast wide excision done on 11/04/14. Patient states she is doing well. HPI  Past Medical History  Diagnosis Date  . Thyroid disease   . Hearing loss   . Chronic kidney disease   . Unspecified vitamin D deficiency   . Asthma   . DDD (degenerative disc disease)   . Barrett esophagus   . Hearing loss 2007  . Deafness   . Hypothyroidism   . GERD (gastroesophageal reflux disease)   . 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 Cervical fusion    . Tonsillectomy  1986  . Cesarean section  1992, 2002  . Breast biopsy Right 05/05/14  . Breast biopsy Right 11/04/2014    Procedure: BREAST BIOPSY;  Surgeon: Robert Bellow, MD;  Location: [facility] ORS;  Service: General;  Laterality: Right;    Family History  Problem Relation Age of Onset  . Cancer Mother 36    breast  . Migraines Daughter   . Cancer Maternal Aunt 24    breast    Social History History  Substance Use Topics  . Smoking status: Former Smoker    Quit date: 05/14/1986  . Smokeless tobacco: Never Used  . Alcohol Use: 0.0 oz/week    0 Standard drinks or equivalent per week     Comment: occasionally    Allergies  Allergen Reactions  . Ivp Dye [Iodinated Diagnostic Agents] Anaphylaxis  . Broccoli [Brassica Oleracea Italica] Diarrhea    Current Outpatient Prescriptions  Medication Sig Dispense Refill  . albuterol (VENTOLIN HFA) 108 (90 BASE) MCG/ACT inhaler Inhale 2 puffs into the lungs every 6 (six) hours as needed. (Patient taking differently: Inhale 2 puffs into the lungs every 6 (six) hours as needed for wheezing or shortness of breath. ) 18 g 3  . cetirizine (ZYRTEC) 10 MG tablet Take 10 mg  by mouth daily as needed for allergies.    . Cholecalciferol (VITAMIN D) 2000 UNITS CAPS Take 2,000 Units by mouth daily.    . Fluticasone-Salmeterol (ADVAIR DISKUS) 250-50 MCG/DOSE AEPB Inhale 1 puff into the lungs 2 (two) times daily. 180 each 1  . levothyroxine (SYNTHROID, LEVOTHROID) 50 MCG tablet TAKE 1 TABLET BY MOUTH EVERY DAY 90 tablet 2  . naproxen sodium (ANAPROX) 220 MG tablet Take 220 mg by mouth as needed (pain).     Marland Kitchen omeprazole (PRILOSEC) 20 MG capsule Take 20-40 mg by mouth daily. Take two caps in the morning and one cap at night     No current facility-administered medications for this visit.    Review of Systems Review of Systems  Constitutional: Negative.   Respiratory: Negative.   Cardiovascular: Negative.     Blood pressure 144/88, pulse 84, resp. rate 14, height 5\' 6"  (1.676 m), weight 202 lb (91.627 kg).  Physical Exam Physical Exam  Constitutional: She is oriented to person, place, and time. She appears well-developed and well-nourished.  Eyes: Conjunctivae are normal. No scleral icterus.  Neck: Neck supple.  Pulmonary/Chest:    Right breast is clean and healing well, still a little thickening  Lymphadenopathy:    She has no cervical adenopathy.  Neurological: She is alert and oriented to person, place, and time.  Skin: Skin is dry.  Patient states she didn't want to take Tamoxifen.   Assessment    Doing well status post wide excision for ADH.    Plan    Reviewed the pros and cons of antiestrogen therapy. Tamoxifen is troublesome due to its risk of uterine cancer and more importantly DVT for the patient. We discussed use of Evista which has no risk of DVT or uterine cancer but is significantly more costly. She'll consider this option and decide whether she wants to initiate therapy.  One of 2 biopsy clips was removed and the specimen, the large volume of tissue was removed with only the findings of ADH. She was advised that we'll obtain mammograms  at her next visit and likely one clip will be identified well away from the surgical site.     Patient to return in 5 month bilateral diagnotic mammogram.   PCP:  Sonnie Alamo 12/09/2014, 7:47 PM

## 2014-12-08 NOTE — Patient Instructions (Addendum)
Patient to return in 4-5 month bilateral  diagnotic mammogram.

## 2015-04-01 ENCOUNTER — Other Ambulatory Visit: Payer: Self-pay | Admitting: Internal Medicine

## 2015-04-02 ENCOUNTER — Other Ambulatory Visit: Payer: Self-pay | Admitting: Internal Medicine

## 2015-04-04 ENCOUNTER — Other Ambulatory Visit: Payer: Self-pay

## 2015-04-27 ENCOUNTER — Ambulatory Visit: Payer: BLUE CROSS/BLUE SHIELD | Admitting: General Surgery

## 2015-04-29 ENCOUNTER — Encounter: Payer: Self-pay | Admitting: General Surgery

## 2015-05-05 ENCOUNTER — Ambulatory Visit (INDEPENDENT_AMBULATORY_CARE_PROVIDER_SITE_OTHER): Payer: BLUE CROSS/BLUE SHIELD | Admitting: General Surgery

## 2015-05-05 ENCOUNTER — Encounter: Payer: Self-pay | Admitting: General Surgery

## 2015-05-05 VITALS — BP 142/80 | HR 80 | Resp 18 | Ht 66.0 in | Wt 205.0 lb

## 2015-05-05 DIAGNOSIS — N62 Hypertrophy of breast: Secondary | ICD-10-CM

## 2015-05-05 DIAGNOSIS — N6091 Unspecified benign mammary dysplasia of right breast: Secondary | ICD-10-CM

## 2015-05-05 MED ORDER — TAMOXIFEN CITRATE 20 MG PO TABS
20.0000 mg | ORAL_TABLET | Freq: Every day | ORAL | Status: DC
Start: 2015-05-05 — End: 2016-04-27

## 2015-05-05 NOTE — Progress Notes (Signed)
Patient ID: Elizabeth Rice, female   DOB: 09-Sep-1955, 58 y.o.   MRN: [id number]  Chief Complaint  Patient presents with  . Follow-up    mammogram 04-29-15    HPI Elizabeth Rice is a 59 y.o. female.  who presents for her follow up atypical ductal hyperplasia and a breast evaluation. The most recent mammogram was done on 04-29-15.  Patient does perform regular self breast checks and gets regular mammograms done.  No new breast issues.  Personally reviewed the patient's history.  Marland Kitchen HPI  Past Medical History  Diagnosis Date  . Thyroid disease   . Hearing loss   . Chronic kidney disease   . Unspecified vitamin D deficiency   . Asthma   . DDD (degenerative disc disease)   . Barrett esophagus     Dr Vira Agar  . Hearing loss 2007  . Deafness   . Hypothyroidism   . GERD (gastroesophageal reflux disease)   . 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 Cervical fusion    . Tonsillectomy  1986  . Cesarean section  1992, 2002  . Breast biopsy Right 05/05/14  . Breast biopsy Right 11/04/2014    Procedure: BREAST BIOPSY;  Surgeon: Robert Bellow, MD;  Location: [facility] ORS;  Service: General;  Laterality: Right;    Family History  Problem Relation Age of Onset  . Cancer Mother 5    breast  . Migraines Daughter   . Cancer Maternal Aunt 24    breast    Social History Social History  Substance Use Topics  . Smoking status: Former Smoker    Quit date: 05/14/1986  . Smokeless tobacco: Never Used  . Alcohol Use: 0.0 oz/week    0 Standard drinks or equivalent per week     Comment: occasionally    Allergies  Allergen Reactions  . Ivp Dye [Iodinated Diagnostic Agents] Anaphylaxis  . Broccoli [Brassica Oleracea Italica] Diarrhea    Current Outpatient Prescriptions  Medication Sig Dispense Refill  . cetirizine (ZYRTEC) 10 MG tablet Take 10 mg by mouth daily as  needed for allergies.    . Cholecalciferol (VITAMIN D) 2000 UNITS CAPS Take 2,000 Units by mouth daily.    . Fluticasone-Salmeterol (ADVAIR DISKUS) 250-50 MCG/DOSE AEPB Inhale 1 puff into the lungs 2 (two) times daily. 180 each 1  . levothyroxine (SYNTHROID, LEVOTHROID) 50 MCG tablet TAKE 1 TABLET (50 MCG TOTAL) BY MOUTH DAILY. 90 tablet 3  . naproxen sodium (ANAPROX) 220 MG tablet Take 220 mg by mouth as needed (pain).     Marland Kitchen omeprazole (PRILOSEC) 20 MG capsule Take 20 mg by mouth daily. Take two caps in the morning and one cap at night    . PROAIR HFA 108 (90 BASE) MCG/ACT inhaler INHALE 2 PUFFS INTO THE LUNGS EVERY 6 (SIX) HOURS AS NEEDED. 8.5 Inhaler 2  . tamoxifen (NOLVADEX) 20 MG tablet Take 1 tablet (20 mg total) by mouth daily. 30 tablet 11   No current facility-administered medications for this visit.    Review of Systems Review of Systems  Constitutional: Negative.   Respiratory: Positive for cough.   Cardiovascular: Negative.     Blood pressure 142/80, pulse 80, resp. rate 18, height 5\' 6"  (1.676 m), weight 205 lb (92.987 kg).  Physical Exam Physical Exam  Constitutional: She is oriented to person, place, and time. She appears well-developed  and well-nourished.  HENT:  Mouth/Throat: Oropharynx is clear and moist.  Eyes: Conjunctivae are normal. No scleral icterus.  Neck: Neck supple.  Cardiovascular: Normal rate, regular rhythm and normal heart sounds.   Pulmonary/Chest: Effort normal and breath sounds normal. Right breast exhibits no inverted nipple, no mass, no nipple discharge, no skin change and no tenderness. Left breast exhibits no inverted nipple, no mass, no nipple discharge, no skin change and no tenderness.  Right breast soft incision well healed.   Lymphadenopathy:    She has no cervical adenopathy.    She has no axillary adenopathy.  Neurological: She is alert and oriented to person, place, and time.  Skin: Skin is warm and dry.  Psychiatric: Her behavior is  normal.   Data:  04/29/2015 bilateral diagnostic mammograms completed at [facility] were reviewed. Postsurgical changes noted. BI-RADS-2.  Assessment    Doing well status post breast conservation. Now amenable to a trial of tamoxifen.    Plan          Reviewed the pros and cons of antiestrogen therapy. Trial Tamoxifen for one month and phone call with status after one month, pt to take 81 mg aspirin as well. The patient has been asked to return to the office in one year with a bilateral diagnostic mammogram.   PCP:  Ronette Deter This information has been scribed by Karie Fetch RNBC.  Robert Bellow 05/06/2015, 1:27 PM

## 2015-05-05 NOTE — Patient Instructions (Addendum)
The patient is aware to call back for any questions or concerns. The patient has been asked to return to the office in one year with a bilateral diagnostic mammogram  aspirin 81 mg Tamoxifen oral tablet What is this medicine? TAMOXIFEN (ta MOX i fen) blocks the effects of estrogen. It is commonly used to treat breast cancer. It is also used to decrease the chance of breast cancer coming back in women who have received treatment for the disease. It may also help prevent breast cancer in women who have a high risk of developing breast cancer. This medicine may be used for other purposes; ask your health care provider or pharmacist if you have questions. What should I tell my health care provider before I take this medicine? They need to know if you have any of these conditions: -blood clots -blood disease -cataracts or impaired eyesight -endometriosis -high calcium levels -high cholesterol -irregular menstrual cycles -liver disease -stroke -uterine fibroids -an unusual or allergic reaction to tamoxifen, other medicines, foods, dyes, or preservatives -pregnant or trying to get pregnant -breast-feeding How should I use this medicine? Take this medicine by mouth with a glass of water. Follow the directions on the prescription label. You can take it with or without food. Take your medicine at regular intervals. Do not take your medicine more often than directed. Do not stop taking except on your doctor's advice. A special MedGuide will be given to you by the pharmacist with each prescription and refill. Be sure to read this information carefully each time. Talk to your pediatrician regarding the use of this medicine in children. While this drug may be prescribed for selected conditions, precautions do apply. Overdosage: If you think you have taken too much of this medicine contact a poison control center or emergency room at once. NOTE: This medicine is only for you. Do not share this medicine  with others. What if I miss a dose? If you miss a dose, take it as soon as you can. If it is almost time for your next dose, take only that dose. Do not take double or extra doses. What may interact with this medicine? -aminoglutethimide -bromocriptine -chemotherapy drugs -female hormones, like estrogens and birth control pills -letrozole -medroxyprogesterone -phenobarbital -rifampin -warfarin This list may not describe all possible interactions. Give your health care provider a list of all the medicines, herbs, non-prescription drugs, or dietary supplements you use. Also tell them if you smoke, drink alcohol, or use illegal drugs. Some items may interact with your medicine. What should I watch for while using this medicine? Visit your doctor or health care professional for regular checks on your progress. You will need regular pelvic exams, breast exams, and mammograms. If you are taking this medicine to reduce your risk of getting breast cancer, you should know that this medicine does not prevent all types of breast cancer. If breast cancer or other problems occur, there is no guarantee that it will be found at an early stage. Do not become pregnant while taking this medicine or for 2 months after stopping this medicine. Stop taking this medicine if you get pregnant or think you are pregnant and contact your doctor. This medicine may harm your unborn baby. Women who can possibly become pregnant should use birth control methods that do not use hormones during tamoxifen treatment and for 2 months after therapy has stopped. Talk with your health care provider for birth control advice. Do not breast feed while taking this medicine. What side effects  may I notice from receiving this medicine? Side effects that you should report to your doctor or health care professional as soon as possible: -changes in vision (blurred vision) -changes in your menstrual cycle -difficulty breathing or shortness of  breath -difficulty walking or talking -new breast lumps -numbness -pelvic pain or pressure -redness, blistering, peeling or loosening of the skin, including inside the mouth -skin rash or itching (hives) -sudden chest pain -swelling of lips, face, or tongue -swelling, pain or tenderness in your calf or leg -unusual bruising or bleeding -vaginal discharge that is bloody, brown, or rust -weakness -yellowing of the whites of the eyes or skin Side effects that usually do not require medical attention (report to your doctor or health care professional if they continue or are bothersome): -fatigue -hair loss, although uncommon and is usually mild -headache -hot flashes -impotence (in men) -nausea, vomiting (mild) -vaginal discharge (white or clear) This list may not describe all possible side effects. Call your doctor for medical advice about side effects. You may report side effects to FDA at 1-800-FDA-1088. Where should I keep my medicine? Keep out of the reach of children. Store at room temperature between 20 and 25 degrees C (68 and 77 degrees F). Protect from light. Keep container tightly closed. Throw away any unused medicine after the expiration date. NOTE: This sheet is a summary. It may not cover all possible information. If you have questions about this medicine, talk to your doctor, pharmacist, or health care provider.    2016, Elsevier/Gold Standard. (2008-01-15 12:01:56)

## 2015-05-12 ENCOUNTER — Other Ambulatory Visit: Payer: Self-pay | Admitting: Internal Medicine

## 2015-05-12 NOTE — Telephone Encounter (Signed)
Last OV was 06/17/14, Please advise refill?

## 2015-07-04 ENCOUNTER — Other Ambulatory Visit: Payer: Self-pay | Admitting: Internal Medicine

## 2015-10-07 ENCOUNTER — Encounter: Payer: Self-pay | Admitting: Family Medicine

## 2015-10-07 ENCOUNTER — Ambulatory Visit (INDEPENDENT_AMBULATORY_CARE_PROVIDER_SITE_OTHER): Payer: BLUE CROSS/BLUE SHIELD | Admitting: Family Medicine

## 2015-10-07 VITALS — BP 132/78 | HR 82 | Temp 97.8°F | Wt 200.0 lb

## 2015-10-07 DIAGNOSIS — H6690 Otitis media, unspecified, unspecified ear: Secondary | ICD-10-CM | POA: Insufficient documentation

## 2015-10-07 DIAGNOSIS — H6693 Otitis media, unspecified, bilateral: Secondary | ICD-10-CM | POA: Diagnosis not present

## 2015-10-07 MED ORDER — AMOXICILLIN-POT CLAVULANATE 875-125 MG PO TABS
1.0000 | ORAL_TABLET | Freq: Two times a day (BID) | ORAL | Status: DC
Start: 2015-10-07 — End: 2016-03-13

## 2015-10-07 MED ORDER — HYDROCOD POLST-CPM POLST ER 10-8 MG/5ML PO SUER: 5.0000 mL | Freq: Two times a day (BID) | ORAL | Status: DC | PRN

## 2015-10-07 NOTE — Progress Notes (Signed)
Subjective:  Patient ID: Elizabeth Rice, female    DOB: 03-02-1956  Age: 60 y.o. MRN: [id number]  CC: Congestion, ear pain, ST, Cough, Fatigue, Hoarseness  HPI:  60 year old female presents with URI complaints.  Patient reports that she has not been feeling well since this past weekend. She states that she's been experiencing ear pain and worsening hearing loss. She has chronic hearing loss already. She reports that she's been experiencing hoarseness, congestion, sore throat, cough, and fatigue as well. She also states that her eyes have been draining. She's been using Benadryl and Mucinex without improvement. No known exacerbating factors. No associated fever or chills. No other complaints this time.  Social Hx   Social History   Social History  . Marital Status: Single    Spouse Name: N/A  . Number of Children: N/A  . Years of Education: N/A   Social History Main Topics  . Smoking status: Former Smoker    Quit date: 05/14/1986  . Smokeless tobacco: Never Used  . Alcohol Use: 0.0 oz/week    0 Standard drinks or equivalent per week     Comment: occasionally  . Drug Use: No  . Sexual Activity: Not Asked   Other Topics Concern  . None   Social History Narrative   Lives in [place]. Divorced.    Review of Systems  Constitutional: Positive for fatigue. Negative for fever.  HENT: Positive for congestion, ear pain, hearing loss and sore throat.   Respiratory: Positive for cough.    Objective:  BP 132/78 mmHg  Pulse 82  Temp(Src) 97.8 F (36.6 C) (Oral)  Wt 200 lb (90.719 kg)  SpO2 97%  BP/Weight 10/07/2015 05/05/2015 123456  Systolic BP [id number]  Diastolic BP 78 80 88  Wt. (Lbs) 200 205 202  BMI 32.3 33.1 32.62   Physical Exam  Constitutional: She is oriented to person, place, and time. She appears well-developed. No distress.  HENT:  Oropharynx with mild erythema. TMs were bulging and dullness bilaterally. Mild erythema.  Eyes: Conjunctivae are normal.  Right eye exhibits no discharge. Left eye exhibits no discharge.  Neck: Neck supple.  Cardiovascular: Normal rate and regular rhythm.   Pulmonary/Chest: Effort normal. She has no wheezes. She has no rales.  Lymphadenopathy:    She has no cervical adenopathy.  Neurological: She is alert and oriented to person, place, and time.  Psychiatric: She has a normal mood and affect.  Vitals reviewed.  Lab Results  Component Value Date   WBC 9.1 06/17/2014   HGB 14.9 06/17/2014   HCT 43.6 06/17/2014   PLT 278.0 06/17/2014   GLUCOSE 97 10/28/2014   CHOL 217* 06/17/2014   TRIG 109.0 06/17/2014   HDL 79.50 06/17/2014   LDLDIRECT 111.1 07/17/2012   LDLCALC 116* 06/17/2014   ALT 24 06/17/2014   AST 19 06/17/2014   NA 139 10/28/2014   K 3.9 10/28/2014   CL 105 10/28/2014   CREATININE 1.07* 10/28/2014   BUN 25* 10/28/2014   CO2 27 10/28/2014   TSH 2.06 06/17/2014   HGBA1C 5.6 06/17/2014   MICROALBUR 1.2 06/17/2014   Assessment & Plan:   Problem List Items Addressed This Visit    Otitis media - Primary    New acute problem. Treating with Augmentin. Tussionex was given for her cough.      Relevant Medications   amoxicillin-clavulanate (AUGMENTIN) 875-125 MG tablet      Meds ordered this encounter  Medications  . amoxicillin-clavulanate (AUGMENTIN) 875-125 MG tablet  Sig: Take 1 tablet by mouth 2 (two) times daily.    Dispense:  20 tablet    Refill:  0  . chlorpheniramine-HYDROcodone (TUSSIONEX PENNKINETIC ER) 10-8 MG/5ML SUER    Sig: Take 5 mLs by mouth every 12 (twelve) hours as needed.    Dispense:  115 mL    Refill:  0   Follow-up: PRN  Harvey C[facility]ars

## 2015-10-07 NOTE — Progress Notes (Signed)
Pre visit review using our clinic review tool, if applicable. No additional management support is needed unless otherwise documented below in the visit note. 

## 2015-10-07 NOTE — Patient Instructions (Signed)
Take the augmentin twice daily.  Call if you fail to improve or worsen.  Take care  Dr. Lacinda Axon

## 2015-10-07 NOTE — Assessment & Plan Note (Signed)
New acute problem. Treating with Augmentin. Tussionex was given for her cough.

## 2015-11-28 ENCOUNTER — Ambulatory Visit: Payer: BLUE CROSS/BLUE SHIELD | Admitting: Internal Medicine

## 2015-11-28 DIAGNOSIS — Z0289 Encounter for other administrative examinations: Secondary | ICD-10-CM

## 2016-02-29 ENCOUNTER — Telehealth: Payer: Self-pay

## 2016-03-07 ENCOUNTER — Other Ambulatory Visit: Payer: Self-pay

## 2016-03-07 DIAGNOSIS — N6091 Unspecified benign mammary dysplasia of right breast: Secondary | ICD-10-CM

## 2016-03-13 ENCOUNTER — Encounter: Payer: Self-pay | Admitting: Emergency Medicine

## 2016-03-13 ENCOUNTER — Emergency Department: Payer: BLUE CROSS/BLUE SHIELD

## 2016-03-13 ENCOUNTER — Observation Stay
Admission: EM | Admit: 2016-03-13 | Discharge: 2016-03-16 | Disposition: A | Discharge status: Home / Self Care | Payer: BLUE CROSS/BLUE SHIELD | Attending: Surgery | Admitting: Surgery

## 2016-03-13 DIAGNOSIS — E039 Hypothyroidism, unspecified: Secondary | ICD-10-CM | POA: Diagnosis not present

## 2016-03-13 DIAGNOSIS — K8066 Calculus of gallbladder and bile duct with acute and chronic cholecystitis without obstruction: Secondary | ICD-10-CM | POA: Diagnosis not present

## 2016-03-13 DIAGNOSIS — Z79899 Other long term (current) drug therapy: Secondary | ICD-10-CM | POA: Diagnosis not present

## 2016-03-13 DIAGNOSIS — J45909 Unspecified asthma, uncomplicated: Secondary | ICD-10-CM | POA: Diagnosis not present

## 2016-03-13 DIAGNOSIS — K8050 Calculus of bile duct without cholangitis or cholecystitis without obstruction: Secondary | ICD-10-CM

## 2016-03-13 DIAGNOSIS — M1990 Unspecified osteoarthritis, unspecified site: Secondary | ICD-10-CM | POA: Insufficient documentation

## 2016-03-13 DIAGNOSIS — K819 Cholecystitis, unspecified: Secondary | ICD-10-CM | POA: Diagnosis not present

## 2016-03-13 DIAGNOSIS — Z7952 Long term (current) use of systemic steroids: Secondary | ICD-10-CM | POA: Insufficient documentation

## 2016-03-13 DIAGNOSIS — K810 Acute cholecystitis: Secondary | ICD-10-CM | POA: Diagnosis present

## 2016-03-13 DIAGNOSIS — K219 Gastro-esophageal reflux disease without esophagitis: Secondary | ICD-10-CM | POA: Insufficient documentation

## 2016-03-13 DIAGNOSIS — Z87891 Personal history of nicotine dependence: Secondary | ICD-10-CM | POA: Diagnosis not present

## 2016-03-13 LAB — CBC
HEMATOCRIT: 44.7 % (ref 35.0–47.0)
HEMOGLOBIN: 15.6 g/dL (ref 12.0–16.0)
MCH: 30.5 pg (ref 26.0–34.0)
MCHC: 34.8 g/dL (ref 32.0–36.0)
MCV: 87.7 fL (ref 80.0–100.0)
Platelets: 291 10*3/uL (ref 150–440)
RBC: 5.1 MIL/uL (ref 3.80–5.20)
RDW: 12.7 % (ref 11.5–14.5)
WBC: 15.4 10*3/uL — AB (ref 3.6–11.0)

## 2016-03-13 LAB — COMPREHENSIVE METABOLIC PANEL
ALT: 29 U/L (ref 14–54)
ANION GAP: 11 (ref 5–15)
AST: 28 U/L (ref 15–41)
Albumin: 5 g/dL (ref 3.5–5.0)
Alkaline Phosphatase: 84 U/L (ref 38–126)
BUN: 13 mg/dL (ref 6–20)
CHLORIDE: 103 mmol/L (ref 101–111)
CO2: 24 mmol/L (ref 22–32)
Calcium: 10 mg/dL (ref 8.9–10.3)
Creatinine, Ser: 0.96 mg/dL (ref 0.44–1.00)
Glucose, Bld: 125 mg/dL — ABNORMAL HIGH (ref 65–99)
POTASSIUM: 3.9 mmol/L (ref 3.5–5.1)
Sodium: 138 mmol/L (ref 135–145)
Total Bilirubin: 0.7 mg/dL (ref 0.3–1.2)
Total Protein: 8.1 g/dL (ref 6.5–8.1)

## 2016-03-13 LAB — LIPASE, BLOOD: LIPASE: 23 U/L (ref 11–51)

## 2016-03-13 MED ORDER — ONDANSETRON HCL 4 MG PO TABS: 4.0000 mg | ORAL_TABLET | Freq: Four times a day (QID) | ORAL | Status: DC | PRN

## 2016-03-13 MED ORDER — DEXTROSE-NACL 5-0.9 % IV SOLN
INTRAVENOUS | Status: DC
  Administered 2016-03-13: via INTRAVENOUS
  Administered 2016-03-14: 1000 mL via INTRAVENOUS
  Administered 2016-03-14 – 2016-03-15 (×2): via INTRAVENOUS

## 2016-03-13 MED ORDER — NAPROXEN 250 MG PO TABS
250.0000 mg | ORAL_TABLET | ORAL | Status: DC | PRN
  Filled 2016-03-13: qty 1

## 2016-03-13 MED ORDER — ONDANSETRON HCL 4 MG/2ML IJ SOLN
INTRAMUSCULAR | Status: AC
  Administered 2016-03-13: 4 mg via INTRAVENOUS
  Filled 2016-03-13: qty 2

## 2016-03-13 MED ORDER — LORATADINE 10 MG PO TABS
10.0000 mg | ORAL_TABLET | Freq: Every day | ORAL | Status: DC
Start: 2016-03-13 — End: 2016-03-16
  Administered 2016-03-14 – 2016-03-15 (×2): 10 mg via ORAL
  Filled 2016-03-13 (×2): qty 1

## 2016-03-13 MED ORDER — LEVOTHYROXINE SODIUM 50 MCG PO TABS
50.0000 ug | ORAL_TABLET | Freq: Every day | ORAL | Status: DC
  Administered 2016-03-14 – 2016-03-16 (×3): 50 ug via ORAL
  Filled 2016-03-13 (×3): qty 1

## 2016-03-13 MED ORDER — ONDANSETRON HCL 4 MG/2ML IJ SOLN
4.0000 mg | Freq: Once | INTRAMUSCULAR | Status: AC
  Administered 2016-03-13 (×2): 4 mg via INTRAVENOUS

## 2016-03-13 MED ORDER — MORPHINE SULFATE (PF) 2 MG/ML IV SOLN
INTRAVENOUS | Status: AC
  Administered 2016-03-13: 2 mg via INTRAVENOUS
  Filled 2016-03-13: qty 1

## 2016-03-13 MED ORDER — HEPARIN SODIUM (PORCINE) 5000 UNIT/ML IJ SOLN
5000.0000 [IU] | Freq: Three times a day (TID) | INTRAMUSCULAR | Status: DC
  Administered 2016-03-14 – 2016-03-16 (×7): 5000 [IU] via SUBCUTANEOUS
  Filled 2016-03-13 (×7): qty 1

## 2016-03-13 MED ORDER — CEFAZOLIN IN D5W 1 GM/50ML IV SOLN
1.0000 g | Freq: Four times a day (QID) | INTRAVENOUS | Status: DC
  Administered 2016-03-14 (×3): 1 g via INTRAVENOUS
  Filled 2016-03-13 (×5): qty 50

## 2016-03-13 MED ORDER — MOMETASONE FURO-FORMOTEROL FUM 200-5 MCG/ACT IN AERO
2.0000 | INHALATION_SPRAY | Freq: Two times a day (BID) | RESPIRATORY_TRACT | Status: DC
  Administered 2016-03-14 – 2016-03-16 (×5): 2 via RESPIRATORY_TRACT
  Filled 2016-03-13: qty 8.8

## 2016-03-13 MED ORDER — PIPERACILLIN-TAZOBACTAM 3.375 G IVPB
3.3750 g | Freq: Once | INTRAVENOUS | Status: AC
  Administered 2016-03-13: 3.375 g via INTRAVENOUS
  Filled 2016-03-13: qty 50

## 2016-03-13 MED ORDER — ONDANSETRON HCL 4 MG/2ML IJ SOLN
4.0000 mg | Freq: Once | INTRAMUSCULAR | Status: AC
  Administered 2016-03-13: 4 mg via INTRAVENOUS
  Filled 2016-03-13: qty 2

## 2016-03-13 MED ORDER — INFLUENZA VAC SPLIT QUAD 0.5 ML IM SUSY: 0.5000 mL | PREFILLED_SYRINGE | INTRAMUSCULAR | Status: DC

## 2016-03-13 MED ORDER — PANTOPRAZOLE SODIUM 40 MG PO TBEC
40.0000 mg | DELAYED_RELEASE_TABLET | Freq: Every day | ORAL | Status: DC
Start: 2016-03-13 — End: 2016-03-16
  Administered 2016-03-14 – 2016-03-15 (×2): 40 mg via ORAL
  Filled 2016-03-13 (×2): qty 1

## 2016-03-13 MED ORDER — MORPHINE SULFATE (PF) 2 MG/ML IV SOLN
2.0000 mg | INTRAVENOUS | Status: DC | PRN
  Administered 2016-03-13 – 2016-03-14 (×4): 2 mg via INTRAVENOUS
  Filled 2016-03-13 (×3): qty 1

## 2016-03-13 MED ORDER — HYDROMORPHONE HCL 1 MG/ML IJ SOLN
0.5000 mg | Freq: Once | INTRAMUSCULAR | Status: AC
  Administered 2016-03-13: 0.5 mg via INTRAVENOUS
  Filled 2016-03-13: qty 1

## 2016-03-13 MED ORDER — ALBUTEROL SULFATE (2.5 MG/3ML) 0.083% IN NEBU
3.0000 mL | INHALATION_SOLUTION | Freq: Four times a day (QID) | RESPIRATORY_TRACT | Status: DC
  Administered 2016-03-14 – 2016-03-16 (×7): 3 mL via RESPIRATORY_TRACT
  Filled 2016-03-13 (×7): qty 3

## 2016-03-13 MED ORDER — TAMOXIFEN CITRATE 10 MG PO TABS
20.0000 mg | ORAL_TABLET | Freq: Every day | ORAL | Status: DC
Start: 2016-03-13 — End: 2016-03-16
  Administered 2016-03-14 – 2016-03-15 (×2): 20 mg via ORAL
  Filled 2016-03-13 (×4): qty 2

## 2016-03-13 MED ORDER — PNEUMOCOCCAL VAC POLYVALENT 25 MCG/0.5ML IJ INJ: 0.5000 mL | INJECTION | INTRAMUSCULAR | Status: DC

## 2016-03-13 MED ORDER — ONDANSETRON HCL 4 MG/2ML IJ SOLN
4.0000 mg | Freq: Four times a day (QID) | INTRAMUSCULAR | Status: DC | PRN
  Administered 2016-03-14 (×2): 4 mg via INTRAVENOUS
  Filled 2016-03-13: qty 2

## 2016-03-13 MED ORDER — ONDANSETRON HCL 4 MG/2ML IJ SOLN
INTRAMUSCULAR | Status: AC
  Filled 2016-03-13: qty 2

## 2016-03-13 MED ORDER — VITAMIN D 1000 UNITS PO TABS
2000.0000 [IU] | ORAL_TABLET | Freq: Every day | ORAL | Status: DC
  Administered 2016-03-14 – 2016-03-15 (×2): 2000 [IU] via ORAL
  Filled 2016-03-13 (×2): qty 2

## 2016-03-13 MED ORDER — SODIUM CHLORIDE 0.9 % IV SOLN
Freq: Once | INTRAVENOUS | Status: AC
  Administered 2016-03-13: 14:00:00 via INTRAVENOUS

## 2016-03-13 MED ORDER — NAPROXEN SODIUM 220 MG PO TABS: 220.0000 mg | ORAL_TABLET | ORAL | Status: DC | PRN

## 2016-03-13 NOTE — ED Triage Notes (Signed)
Having upper abd pain  Positive n/v since last pm

## 2016-03-13 NOTE — ED Notes (Signed)
Patient transported to Ultrasound 

## 2016-03-13 NOTE — Telephone Encounter (Signed)
The patient will have her mammogram at the [facility].

## 2016-03-13 NOTE — H&P (Signed)
Elizabeth Rice is an 60 y.o. female.    Chief Complaint: Right upper quadrant pain  HPI: This patient with one year of recurrent episodic right upper quadrant pain and known gallstones who has delayed any sort of therapy. She comes in today with 24 hours of abdominal pain need to the right upper quadrant. She's had nausea and vomiting no fevers or chills she's had some loose stools  She is on tamoxifen a following excisional biopsy with a strong family history and genetics studies that placed her in a preventative medicine protocol.  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 Asthma   . 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 Barrett esophagus    Dr Elizabeth Rice  . Chronic kidney disease   . DDD (degenerative disc disease)   . Deafness   . GERD (gastroesophageal reflux disease)   . Hearing loss   . Hearing loss 2007  . Hypothyroidism   . Thyroid disease   . Unspecified vitamin D deficiency     Past Surgical History:  Procedure Laterality Date  . BREAST BIOPSY Right 05/05/14  . BREAST BIOPSY Right 11/04/2014   Procedure: BREAST BIOPSY;  Surgeon: Elizabeth Bellow, MD;  Location: [facility] ORS;  Service: General;  Laterality: Right;  . CERVICAL FUSION    . [place], 2002  . TONSILLECTOMY  1986    Family History  Problem Relation Age of Onset  . Cancer Mother 5    breast  . Cancer Maternal Aunt 28    breast  . Migraines Daughter    Social History:  reports that she quit smoking about 29 years ago. She has never used smokeless tobacco. She reports that she drinks alcohol. She reports that she does not use drugs.  Allergies:  Allergies  Allergen Reactions  . Ivp Dye [Iodinated Diagnostic Agents] Anaphylaxis  . Broccoli [Brassica Oleracea Italica] Diarrhea     (Not in a hospital admission)   Review of Systems   Constitutional: Negative for chills, fever and weight loss.  HENT: Negative.   Eyes: Negative.   Respiratory: Negative.   Cardiovascular: Negative.   Gastrointestinal: Positive for abdominal pain, heartburn, nausea and vomiting. Negative for blood in stool, constipation, diarrhea and melena.  Genitourinary: Negative.   Musculoskeletal: Negative.   Skin: Negative.   Neurological: Negative.   Endo/Heme/Allergies: Negative.   Psychiatric/Behavioral: Negative.      Physical Exam:  BP 115/75   Pulse 60   Temp 98.1 F (36.7 C) (Oral)   Resp 17   SpO2 99%   Physical Exam  Constitutional: She is oriented to person, place, and time and well-developed, well-nourished, and in no distress. No distress.  HENT:  Head: Normocephalic and atraumatic.  Eyes: Pupils are equal, round, and reactive to light. Right eye exhibits no discharge. Left eye exhibits no discharge. No scleral icterus.  Neck: Normal range of motion.  Cardiovascular: Normal rate, regular rhythm and normal heart sounds.   Pulmonary/Chest: Effort normal and breath sounds normal. No respiratory distress. She has no wheezes.  Abdominal: Soft. She exhibits no distension. There is tenderness. There is no rebound and no guarding.  Tender in the right upper quadrant questionable Murphy sign  Musculoskeletal: Normal range of motion. She exhibits no edema or tenderness.  Lymphadenopathy:    She has no cervical adenopathy.  Neurological: She is alert  and oriented to person, place, and time.  Skin: Skin is warm and dry. No rash noted. She is not diaphoretic. No erythema.  Psychiatric: Mood and affect normal.  Vitals reviewed.       Results for orders placed or performed during the hospital encounter of 03/13/16 (from the past 48 hour(s))  Lipase, blood     Status: None   Collection Time: 03/13/16 11:13 AM  Result Value Ref Range   Lipase 23 11 - 51 U/L  Comprehensive metabolic panel     Status: Abnormal   Collection Time:  03/13/16 11:13 AM  Result Value Ref Range   Sodium 138 135 - 145 mmol/L   Potassium 3.9 3.5 - 5.1 mmol/L   Chloride 103 101 - 111 mmol/L   CO2 24 22 - 32 mmol/L   Glucose, Bld 125 (H) 65 - 99 mg/dL   BUN 13 6 - 20 mg/dL   Creatinine, Ser 0.96 0.44 - 1.00 mg/dL   Calcium 10.0 8.9 - 10.3 mg/dL   Total Protein 8.1 6.5 - 8.1 g/dL   Albumin 5.0 3.5 - 5.0 g/dL   AST 28 15 - 41 U/L   ALT 29 14 - 54 U/L   Alkaline Phosphatase 84 38 - 126 U/L   Total Bilirubin 0.7 0.3 - 1.2 mg/dL   GFR calc non Af Amer >60 >60 mL/min   GFR calc Af Amer >60 >60 mL/min    Comment: (NOTE) The eGFR has been calculated using the CKD EPI equation. This calculation has not been validated in all clinical situations. eGFR's persistently <60 mL/min signify possible Chronic Kidney Disease.    Anion gap 11 5 - 15  CBC     Status: Abnormal   Collection Time: 03/13/16 11:13 AM  Result Value Ref Range   WBC 15.4 (H) 3.6 - 11.0 K/uL   RBC 5.10 3.80 - 5.20 MIL/uL   Hemoglobin 15.6 12.0 - 16.0 g/dL   HCT 44.7 35.0 - 47.0 %   MCV 87.7 80.0 - 100.0 fL   MCH 30.5 26.0 - 34.0 pg   MCHC 34.8 32.0 - 36.0 g/dL   RDW 12.7 11.5 - 14.5 %   Platelets 291 150 - 440 K/uL   US Abdomen Limited Ruq  Result Date: 03/13/2016 CLINICAL DATA:  Right upper quadrant pain. Chronic renal insufficiency. EXAM: US ABDOMEN LIMITED - RIGHT UPPER QUADRANT COMPARISON:  None in PACs FINDINGS: Gallbladder: The gallbladder contains multiple large stones with the largest measuring 3.1 cm in diameter. The gallbladder wall is thickened. There is no pericholecystic fluid. No positive sonographic Murphy's sign is reported. Common bile duct: Diameter: 4 mm is Liver: The hepatic echotexture is mildly increased. There is no hepatic mass or ductal dilation. IMPRESSION: Multiple gallstones with gallbladder wall thickening which may reflect subacute or chronic cholecystitis. No positive sonographic Murphy's sign is reported in there is no pericholecystic fluid.  Probable fatty infiltrative change of the liver. Electronically Signed   By: Elizabeth  Rice M.D.   On: 03/13/2016 14:49     Assessment/Plan  This a patient with certainly symptomatic cholelithiasis but also signs of acute cholecystitis she is been vomiting and has unrelenting pain. I recommended admission to the hospital with IV antibiotics and at some point she will require laparoscopic cholecystectomy. The rationale for this approach is been discussed the options of outpatient visit and scheduling electively was reviewed but she wants to come in the hospital. We discussed the procedure itself and the risks of bleeding infection recurrence  of symptoms failure to resolve her symptoms and conversion to an open procedure as well as risks of bile duct damage or bile duct leak or bowel injury she understood and agreed to proceed I'll also mention that Dr. Pat Patrick would be performing this in the next 24-48 hour schedule permitting.  Florene Glen, MD, FACS

## 2016-03-13 NOTE — ED Provider Notes (Signed)
[facility] Emergency Department Provider Note        Time seen: ----------------------------------------- 1:57 PM on 03/13/2016 -----------------------------------------    I have reviewed the triage vital signs and the nursing notes.   HISTORY  Chief Complaint Abdominal Pain    HPI Elizabeth Rice is a 60 y.o. female who presents the ER for upper abdominal pain with nausea and vomiting since last night. Patient states she has had what she thinks her gallstone attacks for the last year. Patient states pain is intermittent, nothing makes it particularly better or worse. Patient did have nausea vomiting which is been unusual for the symptoms. Patient currently is nauseous, only has right upper quadrant pain. She denies fevers chills or other complaints.   Past Medical History:  Diagnosis Date  . Asthma   . 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 Barrett esophagus    Dr Vira Agar  . Chronic kidney disease   . DDD (degenerative disc disease)   . Deafness   . GERD (gastroesophageal reflux disease)   . Hearing loss   . Hearing loss 2007  . Hypothyroidism   . Thyroid disease   . Unspecified vitamin D deficiency     Patient Active Problem List   Diagnosis Date Noted  . Otitis media 10/07/2015  . Atypical ductal hyperplasia of right breast 11/12/2014  . Chronic fatigue 06/17/2014  . Fat necrosis (segmental) of breast 06/10/2014  . Breast mass 05/07/2014  . Breast nodule 04/22/2014  . Chronic low back pain 01/21/2013  . Allergic rhinitis 01/21/2013  . Routine general medical examination at a health care facility 07/17/2012  . Radiculopathy of lumbosacral region 12/11/2011  . Breast cancer screening 07/12/2011  . Chronic kidney disease (CKD), stage II (mild) 07/12/2011  . Hypothyroidism 07/12/2011  . Asthma 07/12/2011    Past Surgical History:  Procedure Laterality Date  . BREAST  BIOPSY Right 05/05/14  . BREAST BIOPSY Right 11/04/2014   Procedure: BREAST BIOPSY;  Surgeon: Robert Bellow, MD;  Location: [facility] ORS;  Service: General;  Laterality: Right;  . CERVICAL FUSION    . [place], 2002  . TONSILLECTOMY  1986    Allergies Ivp dye [iodinated diagnostic agents] and Broccoli [brassica oleracea italica]  Social History Social History  Substance Use Topics  . Smoking status: Former Smoker    Quit date: 05/14/1986  . Smokeless tobacco: Never Used  . Alcohol use 0.0 oz/week     Comment: occasionally    Review of Systems Constitutional: Negative for fever. Cardiovascular: Negative for chest pain. Respiratory: Negative for shortness of breath. Gastrointestinal: Positive for abdominal pain, vomiting Genitourinary: Negative for dysuria. Musculoskeletal: Negative for back pain. Skin: Negative for rash. Neurological: Negative for headaches, focal weakness or numbness.  10-point ROS otherwise negative.  ____________________________________________   PHYSICAL EXAM:  VITAL SIGNS: ED Triage Vitals  Enc Vitals Group     BP 03/13/16 1108 126/63     Pulse Rate 03/13/16 1108 67     Resp 03/13/16 1108 20     Temp 03/13/16 1108 98.1 F (36.7 C)     Temp Source 03/13/16 1108 Oral     SpO2 03/13/16 1108 100 %     Weight --      Height --      Head Circumference --      Peak Flow --      Pain Score 03/13/16 1032 8  Pain Loc --      Pain Edu? --      Excl. in [place]? --     Constitutional: Alert and oriented. Well appearing and in no distress. Eyes: Conjunctivae are normal. PERRL. Normal extraocular movements. ENT   Head: Normocephalic and atraumatic.   Nose: No congestion/rhinnorhea.   Mouth/Throat: Mucous membranes are moist.   Neck: No stridor. Cardiovascular: Normal rate, regular rhythm. No murmurs, rubs, or gallops. Respiratory: Normal respiratory effort without tachypnea nor retractions. Breath sounds are clear and equal  bilaterally. No wheezes/rales/rhonchi. Gastrointestinal: Right upper quadrant tenderness, hypoactive bowel sounds, positive Murphy sign Musculoskeletal: Nontender with normal range of motion in all extremities. No lower extremity tenderness nor edema. Neurologic:  Normal speech and language. No gross focal neurologic deficits are appreciated.  Skin:  Skin is warm, dry and intact. No rash noted. Psychiatric: Mood and affect are normal. Speech and behavior are normal.  ___________________________________________  ED COURSE:  Pertinent labs & imaging results that were available during my care of the patient were reviewed by me and considered in my medical decision making (see chart for details). Clinical Course  Patient is in no distress, we will assess with labs and likely right upper quadrant ultrasound.  Procedures ____________________________________________   LABS (pertinent positives/negatives)  Labs Reviewed  COMPREHENSIVE METABOLIC PANEL - Abnormal; Notable for the following:       Result Value   Glucose, Bld 125 (*)    All other components within normal limits  CBC - Abnormal; Notable for the following:    WBC 15.4 (*)    All other components within normal limits  LIPASE, BLOOD  URINALYSIS COMPLETEWITH MICROSCOPIC ([facility] ONLY)    RADIOLOGY  Right upper quadrant ultrasound IMPRESSION: Multiple gallstones with gallbladder wall thickening which may reflect subacute or chronic cholecystitis. No positive sonographic Murphy's sign is reported in there is no pericholecystic fluid.  Probable fatty infiltrative change of the liver.  ____________________________________________  FINAL ASSESSMENT AND PLAN  Chronic cholecystitis, biliary colic  Plan: Patient with labs and imaging as dictated above. Patient was subacute or chronic cholecystitis. I have ordered IV antibiotics for her. She will likely need cholecystectomy. I've discussed with Dr. Pat Patrick for  admission.   Earleen Newport, MD   Note: This dictation was prepared with Dragon dictation. Any transcriptional errors that result from this process are unintentional    Earleen Newport, MD 03/13/16 1527

## 2016-03-14 ENCOUNTER — Encounter
Admission: EM | Disposition: A | Discharge status: Home / Self Care | Payer: Self-pay | Source: Home / Self Care | Attending: Emergency Medicine

## 2016-03-14 ENCOUNTER — Encounter: Payer: Self-pay | Admitting: Anesthesiology

## 2016-03-14 ENCOUNTER — Observation Stay: Payer: BLUE CROSS/BLUE SHIELD

## 2016-03-14 ENCOUNTER — Observation Stay: Payer: BLUE CROSS/BLUE SHIELD | Admitting: Anesthesiology

## 2016-03-14 DIAGNOSIS — K819 Cholecystitis, unspecified: Secondary | ICD-10-CM

## 2016-03-14 DIAGNOSIS — K810 Acute cholecystitis: Secondary | ICD-10-CM

## 2016-03-14 HISTORY — PX: CHOLECYSTECTOMY: SHX55

## 2016-03-14 LAB — COMPREHENSIVE METABOLIC PANEL
ALK PHOS: 62 U/L (ref 38–126)
ALT: 22 U/L (ref 14–54)
ANION GAP: 6 (ref 5–15)
AST: 23 U/L (ref 15–41)
Albumin: 3.6 g/dL (ref 3.5–5.0)
BILIRUBIN TOTAL: 0.5 mg/dL (ref 0.3–1.2)
BUN: 13 mg/dL (ref 6–20)
CALCIUM: 8.1 mg/dL — AB (ref 8.9–10.3)
CO2: 27 mmol/L (ref 22–32)
Chloride: 106 mmol/L (ref 101–111)
Creatinine, Ser: 1.11 mg/dL — ABNORMAL HIGH (ref 0.44–1.00)
GFR calc Af Amer: >60 mL/min (ref >60)
GFR, EST NON AFRICAN AMERICAN: 53 mL/min — AB (ref >60)
GLUCOSE: 120 mg/dL — AB (ref 65–99)
POTASSIUM: 3.5 mmol/L (ref 3.5–5.1)
Sodium: 139 mmol/L (ref 135–145)
TOTAL PROTEIN: 6 g/dL — AB (ref 6.5–8.1)

## 2016-03-14 LAB — CBC
HEMATOCRIT: 39.1 % (ref 35.0–47.0)
Hemoglobin: 13.2 g/dL (ref 12.0–16.0)
MCH: 30.5 pg (ref 26.0–34.0)
MCHC: 33.9 g/dL (ref 32.0–36.0)
MCV: 89.8 fL (ref 80.0–100.0)
PLATELETS: 231 10*3/uL (ref 150–440)
RBC: 4.35 MIL/uL (ref 3.80–5.20)
RDW: 13.1 % (ref 11.5–14.5)
WBC: 11.3 10*3/uL — AB (ref 3.6–11.0)

## 2016-03-14 LAB — URINALYSIS COMPLETE WITH MICROSCOPIC (ARMC ONLY)
BACTERIA UA: NONE SEEN
Bilirubin Urine: NEGATIVE
Glucose, UA: 150 mg/dL — AB
HGB URINE DIPSTICK: NEGATIVE
Ketones, ur: NEGATIVE mg/dL
LEUKOCYTES UA: NEGATIVE
NITRITE: NEGATIVE
PH: 5 (ref 5.0–8.0)
Protein, ur: NEGATIVE mg/dL
SPECIFIC GRAVITY, URINE: 1.009 (ref 1.005–1.030)

## 2016-03-14 LAB — SURGICAL PCR SCREEN
MRSA, PCR: NEGATIVE
STAPHYLOCOCCUS AUREUS: POSITIVE — AB

## 2016-03-14 SURGERY — LAPAROSCOPIC CHOLECYSTECTOMY
Anesthesia: General | Wound class: Clean Contaminated

## 2016-03-14 MED ORDER — OXYCODONE HCL 5 MG/5ML PO SOLN: 5.0000 mg | Freq: Once | ORAL | Status: DC | PRN

## 2016-03-14 MED ORDER — HYDROMORPHONE HCL 1 MG/ML IJ SOLN
1.0000 mg | INTRAMUSCULAR | Status: DC | PRN
  Administered 2016-03-14 – 2016-03-15 (×4): 1 mg via INTRAVENOUS
  Filled 2016-03-14 (×4): qty 1

## 2016-03-14 MED ORDER — CHLORHEXIDINE GLUCONATE CLOTH 2 % EX PADS
6.0000 | MEDICATED_PAD | Freq: Every day | CUTANEOUS | Status: DC
  Administered 2016-03-14: 6 via TOPICAL

## 2016-03-14 MED ORDER — HYDROCODONE-ACETAMINOPHEN 5-325 MG PO TABS
1.0000 | ORAL_TABLET | Freq: Four times a day (QID) | ORAL | Status: DC | PRN
  Administered 2016-03-14 (×2): 1 via ORAL
  Administered 2016-03-15: 2 via ORAL
  Filled 2016-03-14: qty 1
  Filled 2016-03-14: qty 2
  Filled 2016-03-14: qty 1

## 2016-03-14 MED ORDER — ALUM & MAG HYDROXIDE-SIMETH 200-200-20 MG/5ML PO SUSP
30.0000 mL | ORAL | Status: DC | PRN
  Administered 2016-03-14 – 2016-03-15 (×2): 30 mL via ORAL
  Filled 2016-03-14 (×2): qty 30

## 2016-03-14 MED ORDER — PROMETHAZINE HCL 25 MG/ML IJ SOLN
25.0000 mg | Freq: Once | INTRAMUSCULAR | Status: AC
  Administered 2016-03-14: 25 mg via INTRAVENOUS
  Filled 2016-03-14: qty 1

## 2016-03-14 MED ORDER — FENTANYL CITRATE (PF) 100 MCG/2ML IJ SOLN
INTRAMUSCULAR | Status: AC
  Filled 2016-03-14: qty 2

## 2016-03-14 MED ORDER — DEXAMETHASONE SODIUM PHOSPHATE 10 MG/ML IJ SOLN
INTRAMUSCULAR | Status: DC | PRN
  Administered 2016-03-14: 10 mg via INTRAVENOUS

## 2016-03-14 MED ORDER — ROCURONIUM BROMIDE 100 MG/10ML IV SOLN
INTRAVENOUS | Status: DC | PRN
  Administered 2016-03-14: 20 mg via INTRAVENOUS
  Administered 2016-03-14: 10 mg via INTRAVENOUS

## 2016-03-14 MED ORDER — PIPERACILLIN-TAZOBACTAM 3.375 G IVPB
3.3750 g | Freq: Three times a day (TID) | INTRAVENOUS | Status: DC
  Administered 2016-03-14 – 2016-03-15 (×3): 3.375 g via INTRAVENOUS
  Filled 2016-03-14 (×3): qty 50

## 2016-03-14 MED ORDER — SUCCINYLCHOLINE CHLORIDE 20 MG/ML IJ SOLN
INTRAMUSCULAR | Status: DC | PRN
  Administered 2016-03-14: 100 mg via INTRAVENOUS

## 2016-03-14 MED ORDER — MIDAZOLAM HCL 2 MG/2ML IJ SOLN
INTRAMUSCULAR | Status: DC | PRN
  Administered 2016-03-14: 2 mg via INTRAVENOUS

## 2016-03-14 MED ORDER — BUPIVACAINE HCL (PF) 0.25 % IJ SOLN
INTRAMUSCULAR | Status: AC
  Filled 2016-03-14: qty 30

## 2016-03-14 MED ORDER — SUGAMMADEX SODIUM 200 MG/2ML IV SOLN
INTRAVENOUS | Status: DC | PRN
  Administered 2016-03-14: 186.8 mg via INTRAVENOUS

## 2016-03-14 MED ORDER — FENTANYL CITRATE (PF) 100 MCG/2ML IJ SOLN
25.0000 ug | INTRAMUSCULAR | Status: DC | PRN
  Administered 2016-03-14 (×2): 50 ug via INTRAVENOUS

## 2016-03-14 MED ORDER — LABETALOL HCL 5 MG/ML IV SOLN
INTRAVENOUS | Status: DC | PRN
  Administered 2016-03-14: 5 mg via INTRAVENOUS

## 2016-03-14 MED ORDER — PROPOFOL 10 MG/ML IV BOLUS
INTRAVENOUS | Status: DC | PRN
  Administered 2016-03-14: 100 mg via INTRAVENOUS

## 2016-03-14 MED ORDER — BUPIVACAINE HCL (PF) 0.25 % IJ SOLN
INTRAMUSCULAR | Status: DC | PRN
  Administered 2016-03-14: 30 mL

## 2016-03-14 MED ORDER — OXYCODONE HCL 5 MG PO TABS: 5.0000 mg | ORAL_TABLET | Freq: Once | ORAL | Status: DC | PRN

## 2016-03-14 MED ORDER — LIDOCAINE 2% (20 MG/ML) 5 ML SYRINGE
INTRAMUSCULAR | Status: DC | PRN
  Administered 2016-03-14: 100 mg via INTRAVENOUS

## 2016-03-14 MED ORDER — LACTATED RINGERS IV SOLN
INTRAVENOUS | Status: DC | PRN
Start: 2016-03-14 — End: 2016-03-14
  Administered 2016-03-14: 13:00:00 via INTRAVENOUS

## 2016-03-14 MED ORDER — MUPIROCIN 2 % EX OINT
1.0000 "application " | TOPICAL_OINTMENT | Freq: Two times a day (BID) | CUTANEOUS | Status: DC
  Administered 2016-03-14 – 2016-03-15 (×2): 1 via NASAL
  Filled 2016-03-14: qty 22

## 2016-03-14 MED ORDER — FENTANYL CITRATE (PF) 100 MCG/2ML IJ SOLN
INTRAMUSCULAR | Status: DC | PRN
  Administered 2016-03-14: 150 ug via INTRAVENOUS
  Administered 2016-03-14: 100 ug via INTRAVENOUS

## 2016-03-14 SURGICAL SUPPLY — 46 items
APPLIER CLIP ROT 10 11.4 M/L (STAPLE) ×3
BAG COUNTER SPONGE EZ (MISCELLANEOUS) ×2 IMPLANT
BULB RESERV EVAC DRAIN JP 100C (MISCELLANEOUS) ×3 IMPLANT
CANISTER SUCT 1200ML W/VALVE (MISCELLANEOUS) ×3 IMPLANT
CATH REDDICK CHOLANGI 4FR 50CM (CATHETERS) IMPLANT
CHLORAPREP W/TINT 26ML (MISCELLANEOUS) ×3 IMPLANT
CLIP APPLIE ROT 10 11.4 M/L (STAPLE) ×1 IMPLANT
CONRAY 60ML FOR OR (MISCELLANEOUS) ×3 IMPLANT
COUNTER SPONGE BAG EZ (MISCELLANEOUS) ×1
CUTTER FLEX LINEAR 45M (STAPLE) ×3 IMPLANT
DRAIN CHANNEL JP 19F (MISCELLANEOUS) ×3 IMPLANT
DRAPE SHEET LG 3/4 BI-LAMINATE (DRAPES) ×3 IMPLANT
DRSG TEGADERM 2-3/8X2-3/4 SM (GAUZE/BANDAGES/DRESSINGS) ×12 IMPLANT
DRSG TELFA 3X8 NADH (GAUZE/BANDAGES/DRESSINGS) ×3 IMPLANT
ELECT REM PT RETURN 9FT ADLT (ELECTROSURGICAL) ×3
ELECTRODE REM PT RTRN 9FT ADLT (ELECTROSURGICAL) ×1 IMPLANT
GLOVE BIO SURGEON STRL SZ7.5 (GLOVE) ×18 IMPLANT
GLOVE INDICATOR 8.0 STRL GRN (GLOVE) ×3 IMPLANT
GOWN STRL REUS W/ TWL LRG LVL3 (GOWN DISPOSABLE) ×3 IMPLANT
GOWN STRL REUS W/TWL LRG LVL3 (GOWN DISPOSABLE) ×6
GRASPER SUT TROCAR 14GX15 (MISCELLANEOUS) ×3 IMPLANT
IRRIGATION STRYKERFLOW (MISCELLANEOUS) ×1 IMPLANT
IRRIGATOR STRYKERFLOW (MISCELLANEOUS) ×3
IV NS 1000ML (IV SOLUTION) ×4
IV NS 1000ML BAXH (IV SOLUTION) ×2 IMPLANT
LABEL OR SOLS (LABEL) ×3 IMPLANT
NDL SAFETY 18GX1.5 (NEEDLE) ×3 IMPLANT
NEEDLE HYPO 25X1 1.5 SAFETY (NEEDLE) ×3 IMPLANT
NEEDLE INSUFFLATION 14GA 120MM (NEEDLE) ×3 IMPLANT
NS IRRIG 500ML POUR BTL (IV SOLUTION) ×3 IMPLANT
PACK LAP CHOLECYSTECTOMY (MISCELLANEOUS) ×3 IMPLANT
POUCH ENDO CATCH 10MM SPEC (MISCELLANEOUS) ×3 IMPLANT
RELOAD STAPLE TA45 3.5 REG BLU (ENDOMECHANICALS) ×3 IMPLANT
SCISSORS METZENBAUM CVD 33 (INSTRUMENTS) ×3 IMPLANT
SEAL FOR SCOPE WARMER C3101 (MISCELLANEOUS) IMPLANT
SLEEVE ADV FIXATION 5X100MM (TROCAR) ×3 IMPLANT
SUT ETHILON 3-0 KS 30 BLK (SUTURE) ×6 IMPLANT
SUT ETHILON 5-0 FS-2 18 BLK (SUTURE) ×3 IMPLANT
SUT VIC AB 0 CT2 27 (SUTURE) ×3 IMPLANT
SYR 3ML LL SCALE MARK (SYRINGE) ×3 IMPLANT
TROCAR XCEL 12X100 BLDLESS (ENDOMECHANICALS) ×3 IMPLANT
TROCAR Z-THREAD FIOS 11X100 BL (TROCAR) ×3 IMPLANT
TROCAR Z-THREAD OPTICAL 5X100M (TROCAR) ×3 IMPLANT
TROCAR Z-THREAD SLEEVE 11X100 (TROCAR) ×3 IMPLANT
TUBING INSUFFLATOR HI FLOW (MISCELLANEOUS) ×3 IMPLANT
WATER STERILE IRR 1000ML POUR (IV SOLUTION) ×3 IMPLANT

## 2016-03-14 NOTE — Progress Notes (Signed)
I have reviewed the patient's workup and discussed situation with her in detail. Her clinical exam is unchanged from earlier this morning. We will plan for surgical intervention later today. Risks benefits and options of been outlined and accepted.

## 2016-03-14 NOTE — Transfer of Care (Signed)
Immediate Anesthesia Transfer of Care Note  Patient: Elizabeth Rice  Procedure(s) Performed: Procedure(s): LAPAROSCOPIC CHOLECYSTECTOMY (N/A)  Patient Location: PACU  Anesthesia Type:General  Level of Consciousness: awake, alert  and oriented  Airway & Oxygen Therapy: Patient Spontanous Breathing and Patient connected to face mask oxygen  Post-op Assessment: Report given to RN and Post -op Vital signs reviewed and stable  Post vital signs: Reviewed and stable  Last Vitals:  Vitals:   03/14/16 0748 03/14/16 1201  BP: 123/65 104/60  Pulse: 64 60  Resp: 16 16  Temp: 36.8 C     Last Pain:  Vitals:   03/14/16 1114  TempSrc:   PainSc: 0-No pain      Patients Stated Pain Goal: 2 ([phone])  Complications: No apparent anesthesia complications

## 2016-03-14 NOTE — Anesthesia Procedure Notes (Signed)
Procedure Name: Intubation Date/Time: 03/14/2016 12:47 PM Performed by: Marsh Dolly Pre-anesthesia Checklist: Patient identified, Patient being monitored, Timeout performed, Emergency Drugs available and Suction available Patient Re-evaluated:Patient Re-evaluated prior to inductionOxygen Delivery Method: Circle system utilized Preoxygenation: Pre-oxygenation with 100% oxygen Intubation Type: IV induction Ventilation: Mask ventilation without difficulty Laryngoscope Size: Mac and 3 Grade View: Grade I Tube type: Oral Tube size: 7.5 mm Number of attempts: 1 Placement Confirmation: ETT inserted through vocal cords under direct vision,  positive ETCO2 and breath sounds checked- equal and bilateral Secured at: 21 cm Tube secured with: Tape Dental Injury: Teeth and Oropharynx as per pre-operative assessment

## 2016-03-14 NOTE — Op Note (Signed)
03/13/2016 - 03/14/2016  1:54 PM  PATIENT:  Elizabeth Rice  60 y.o. female  PRE-OPERATIVE DIAGNOSIS:  n/a  POST-OPERATIVE DIAGNOSIS:  n/a  PROCEDURE:  Procedure(s): LAPAROSCOPIC CHOLECYSTECTOMY (N/A)  SURGEON:  Surgeon(s) and Role:    * Dia Crawford III, MD - Primary   ASSISTANTS: none   ANESTHESIA:   general  EBL:  Total I/O In: [id number] [I.V.:1117] Out: 405 [Urine:400; Blood:5]   DRAINS: (1) Jackson-Pratt drain(s) with closed bulb suction in the Sub hepatic space   LOCAL MEDICATIONS USED:  MARCAINE      DISPOSITION OF SPECIMEN:  PATHOLOGY   DICTATION: .Dragon Dictation With the patient supine position and after induction appropriate general anesthesia the patient's abdomen was prepped with ChloraPrep and draped sterile towels. The patient was placed headdown feet up position. Small infraumbilical incision was made standard fashion and carried down bluntly through subcutaneous tissue. A varies needle was used cannulate peritoneal cavity. CO2 was insufflated to appropriate pressure measurements. When approximately 2 L of CO2 were instilled a varies needle was withdrawn and an 11 mm port placed under direct vision.  Position was confirmed and CO2 was reinsufflated. The patient's place head up feet down position rotated slightly to the left side. Subxiphoid transverse incision was made 11 mm port inserted under direct vision. 2 lateral ports 5 mm in size were inserted under direct vision.  The gallbladder was tense distended and edematous and inflamed. It was aspirated of about 20 cc of dark colored bile. The gallbladder retracted superiorly and laterally exposing the hepatoduodenal ligament. There was significant edema in the hepatoduodenal ligament. Cystic duct was identified but appeared to be quite short. There was very little room between the cystic duct and it appeared the right hepatic artery crossed over the common bile duct. A window was created behind the cystic duct. The upper  abdominal port was exchanged for 12 mm port and the duct was divided using the Endo GIA stapling device carrying a blue load. The was identified coming off the right hepatic doubly clipped and divided. The gallbladder was then dissected free of its bed in the liver using hook and cautery apparatus. There is significant amount of edema and small vessel bleeding off the bed of the liver. The gallbladder was captured Endo Catch apparatus removed through the upper abdominal incision. There was then copiously irrigated with 2 L of warm saline solution. A 19 [place] Blake drain was inserted through the subxiphoid incision brought out through one the lateral stab was positioned in the subhepatic space. It was secured with 3-0 nylon. The upper midline incision was closed with figure-of-eight sutures of 0 Vicryl using the suture passer apparatus. Penicillin desufflated. All ports withdrawn without difficulty. Skin incisions were closed with comminution 17-year-old 5-0 nylon. There was infiltrated with 0.25% Marcaine for postoperative pain control. Sterile dressings were applied. The patient returned recovery room having tolerated the procedure well. Sponge instrument needle count were correct 2 in the operative.   CARE: Admit to inpatient   PATIENT DISPOSITION:  PACU - hemodynamically stable.   Dia Crawford III, MD

## 2016-03-14 NOTE — Progress Notes (Signed)
CC: Right upper quadrant pain Subjective: This patient with right upper quadrant pain and acute cholecystitis. She feels better today but still has her right upper quadrant pain. Of note she is very hard of hearing and is disabled from that. She has no further nausea or vomiting. She does have a slight headache this morning.  Objective: Vital signs in last 24 hours: Temp:  [98.1 F (36.7 C)-98.6 F (37 C)] 98.6 F (37 C) (11/01 0405) Pulse Rate:  [59-77] 69 (11/01 0405) Resp:  [10-24] 18 (11/01 0405) BP: (112-145)/(62-82) 131/71 (11/01 0405) SpO2:  [96 %-100 %] 97 % (11/01 0405) Weight:  [206 lb 1.6 oz (93.5 kg)] 206 lb 1.6 oz (93.5 kg) (10/31 2320)    Intake/Output from previous day: 10/31 0701 - 11/01 0700 In: 1588.3 [I.V.:1538.3; IV Piggyback:50] Out: 250 [Urine:250] Intake/Output this shift: Total I/O In: 588.3 [I.V.:538.3; IV Piggyback:50] Out: 250 [Urine:250]  Physical exam:  Vital signs are stable and reviewed.  Abdomen is soft less tender but still tender in the right upper quadrant with a questionable Murphy sign calves are nontender no icterus no jaundice  Lab Results: CBC   Recent Labs  03/13/16 1113 03/14/16 0433  WBC 15.4* 11.3*  HGB 15.6 13.2  HCT 44.7 39.1  PLT 291 231   BMET  Recent Labs  03/13/16 1113 03/14/16 0433  NA 138 139  K 3.9 3.5  CL 103 106  CO2 24 27  GLUCOSE 125* 120*  BUN 13 13  CREATININE 0.96 1.11*  CALCIUM 10.0 8.1*   PT/INR No results for input(s): LABPROT, INR in the last 72 hours. ABG No results for input(s): PHART, HCO3 in the last 72 hours.  Invalid input(s): PCO2, PO2  Studies/Results: US Abdomen Limited Ruq  Result Date: 03/13/2016 CLINICAL DATA:  Right upper quadrant pain. Chronic renal insufficiency. EXAM: US ABDOMEN LIMITED - RIGHT UPPER QUADRANT COMPARISON:  None in PACs FINDINGS: Gallbladder: The gallbladder contains multiple large stones with the largest measuring 3.1 cm in diameter. The gallbladder  wall is thickened. There is no pericholecystic fluid. No positive sonographic Murphy's sign is reported. Common bile duct: Diameter: 4 mm is Liver: The hepatic echotexture is mildly increased. There is no hepatic mass or ductal dilation. IMPRESSION: Multiple gallstones with gallbladder wall thickening which may reflect subacute or chronic cholecystitis. No positive sonographic Murphy's sign is reported in there is no pericholecystic fluid. Probable fatty infiltrative change of the liver. Electronically Signed   By: David  Martinique M.D.   On: 03/13/2016 14:49    Anti-infectives: Anti-infectives    Start     Dose/Rate Route Frequency Ordered Stop   03/14/16 0000  ceFAZolin (ANCEF) IVPB 1 g/50 mL premix     1 g 100 mL/hr over 30 Minutes Intravenous Every 6 hours 03/13/16 2117     03/13/16 1530  piperacillin-tazobactam (ZOSYN) IVPB 3.375 g     3.375 g 12.5 mL/hr over 240 Minutes Intravenous  Once 03/13/16 1522 03/13/16 1807      Assessment/Plan:  Improving condition and exam but still with tenderness and suggestion of acute cholecystitis. Recommend laparoscopic cholecystectomy which may be able to be scheduled later today if schedule permits. I discussed with her the scheduling issue and I discussed with her the procedure itself the options of observation the rationale for surgery and the risks of bleeding infection recurrence of symptoms failure to resolve her symptoms conversion to an open procedure bile duct damage bile duct leak retained common bile duct stone any of which could  require further surgery and/or ERCP stent and papillotomy she understood and agreed to proceed  Florene Glen, MD, FACS  03/14/2016

## 2016-03-14 NOTE — Anesthesia Preprocedure Evaluation (Signed)
Anesthesia Evaluation  Patient identified by MRN, date of birth, ID band Patient awake    Reviewed: Allergy & Precautions, H&P , NPO status , Patient's Chart, lab work & pertinent test results  History of Anesthesia Complications Negative for: history of anesthetic complications  Airway Mallampati: III  TM Distance: >3 FB Neck ROM: limited    Dental no notable dental hx. (+) Poor Dentition, Chipped   Pulmonary neg shortness of breath, asthma , former smoker,    Pulmonary exam normal breath sounds clear to auscultation       Cardiovascular Exercise Tolerance: Good (-) angina(-) Past MI and (-) DOE negative cardio ROS Normal cardiovascular exam Rhythm:regular Rate:Normal     Neuro/Psych Seizures - (as a child), Well Controlled,   Neuromuscular disease negative psych ROS   GI/Hepatic Neg liver ROS, GERD  Controlled,  Endo/Other  negative endocrine ROSHypothyroidism   Renal/GU ESRFRenal disease     Musculoskeletal  (+) Arthritis ,   Abdominal   Peds  Hematology negative hematology ROS (+)   Anesthesia Other Findings Past Medical History: No date: Asthma June 2016: Atypical ductal hyperplasia of right breast     Comment: Baker [name]: Five-year: 5.3%, lifetime               27.4% No date: Barrett esophagus     Comment: Dr Vira Agar No date: Chronic kidney disease No date: DDD (degenerative disc disease) No date: Deafness No date: GERD (gastroesophageal reflux disease) No date: Hearing loss 2007: Hearing loss No date: Hypothyroidism No date: Thyroid disease No date: Unspecified vitamin D deficiency  Past Surgical History: 05/05/14: BREAST BIOPSY Right 11/04/2014: BREAST BIOPSY Right     Comment: Procedure: BREAST BIOPSY;  Surgeon: Robert Bellow, MD;  Location: [facility] ORS;  Service:               General;  Laterality: Right; No date: [place], 2002: CESAREAN SECTION 1986:  TONSILLECTOMY  BMI    Body Mass Index:  33.25 kg/m      Reproductive/Obstetrics negative OB ROS                             Anesthesia Physical Anesthesia Plan  ASA: III  Anesthesia Plan: General ETT   Post-op Pain Management:    Induction:   Airway Management Planned:   Additional Equipment:   Intra-op Plan:   Post-operative Plan:   Informed Consent: I have reviewed the patients History and Physical, chart, labs and discussed the procedure including the risks, benefits and alternatives for the proposed anesthesia with the patient or authorized representative who has indicated his/her understanding and acceptance.     Plan Discussed with: Anesthesiologist, CRNA and Surgeon  Anesthesia Plan Comments:         Anesthesia Quick Evaluation

## 2016-03-14 NOTE — Anesthesia Postprocedure Evaluation (Signed)
Anesthesia Post Note  Patient: Elizabeth Rice  Procedure(s) Performed: Procedure(s) (LRB): LAPAROSCOPIC CHOLECYSTECTOMY (N/A)  Patient location during evaluation: PACU Anesthesia Type: General Level of consciousness: awake and alert Pain management: pain level controlled Vital Signs Assessment: post-procedure vital signs reviewed and stable Respiratory status: spontaneous breathing, nonlabored ventilation, respiratory function stable and patient connected to nasal cannula oxygen Cardiovascular status: blood pressure returned to baseline and stable Postop Assessment: no signs of nausea or vomiting Anesthetic complications: no    Last Vitals:  Vitals:   03/14/16 1506 03/14/16 1507  BP: (!) 104/50   Pulse: (!) 59 62  Resp: 16   Temp: 36.3 C     Last Pain:  Vitals:   03/14/16 1506  TempSrc: Oral  PainSc:                  [name] Evva Din

## 2016-03-15 LAB — COMPREHENSIVE METABOLIC PANEL
ALK PHOS: 63 U/L (ref 38–126)
ALT: 36 U/L (ref 14–54)
ANION GAP: 7 (ref 5–15)
AST: 39 U/L (ref 15–41)
Albumin: 3.6 g/dL (ref 3.5–5.0)
BUN: 10 mg/dL (ref 6–20)
CALCIUM: 8 mg/dL — AB (ref 8.9–10.3)
CHLORIDE: 108 mmol/L (ref 101–111)
CO2: 26 mmol/L (ref 22–32)
Creatinine, Ser: 1.16 mg/dL — ABNORMAL HIGH (ref 0.44–1.00)
GFR, EST AFRICAN AMERICAN: 59 mL/min — AB (ref >60)
GFR, EST NON AFRICAN AMERICAN: 50 mL/min — AB (ref >60)
Glucose, Bld: 134 mg/dL — ABNORMAL HIGH (ref 65–99)
Potassium: 3.9 mmol/L (ref 3.5–5.1)
SODIUM: 141 mmol/L (ref 135–145)
Total Bilirubin: 0.2 mg/dL — ABNORMAL LOW (ref 0.3–1.2)
Total Protein: 6.2 g/dL — ABNORMAL LOW (ref 6.5–8.1)

## 2016-03-15 LAB — CBC
HCT: 36.7 % (ref 35.0–47.0)
Hemoglobin: 12.6 g/dL (ref 12.0–16.0)
MCH: 30.6 pg (ref 26.0–34.0)
MCHC: 34.4 g/dL (ref 32.0–36.0)
MCV: 88.9 fL (ref 80.0–100.0)
PLATELETS: 223 10*3/uL (ref 150–440)
RBC: 4.13 MIL/uL (ref 3.80–5.20)
RDW: 13 % (ref 11.5–14.5)
WBC: 13.7 10*3/uL — ABNORMAL HIGH (ref 3.6–11.0)

## 2016-03-15 LAB — SURGICAL PATHOLOGY

## 2016-03-15 NOTE — Progress Notes (Signed)
Subjective:   She is doing quite well today. She's been up ambulating without difficulty and reports no further nausea. She has had minimal abdominal discomfort. She's taking minimal pain medicine.Vital signs in last 24 hours: Temp:  [98.2 F (36.8 C)-98.5 F (36.9 C)] 98.3 F (36.8 C) (11/02 1402) Pulse Rate:  [55-66] 60 (11/02 1402) Resp:  [18] 18 (11/02 1402) BP: (98-130)/(51-68) 108/68 (11/02 1500) SpO2:  [95 %-100 %] 100 % (11/02 1402) Last BM Date: 03/12/16  Intake/Output from previous day: 11/01 0701 - 11/02 0700 In: 3737 [P.O.:240; I.V.:2973; IV Piggyback:39] Out: [id number] [Urine:1550; Drains:205; Blood:5]  Exam:  Her wound looks good. There is no sign of any infection. She has serosanguineous drainage.  Lab Results:  CBC  Recent Labs  03/14/16 0433 03/15/16 0422  WBC 11.3* 13.7*  HGB 13.2 12.6  HCT 39.1 36.7  PLT 231 223   CMP     Component Value Date/Time   NA 141 03/15/2016 0422   NA 138 07/07/2010   K 3.9 03/15/2016 0422   CL 108 03/15/2016 0422   CO2 26 03/15/2016 0422   GLUCOSE 134 (H) 03/15/2016 0422   BUN 10 03/15/2016 0422   BUN 22 (A) 07/07/2010   CREATININE 1.16 (H) 03/15/2016 0422   CALCIUM 8.0 (L) 03/15/2016 0422   PROT 6.2 (L) 03/15/2016 0422   ALBUMIN 3.6 03/15/2016 0422   AST 39 03/15/2016 0422   ALT 36 03/15/2016 0422   ALKPHOS 63 03/15/2016 0422   BILITOT 0.2 (L) 03/15/2016 0422   GFRNONAA 50 (L) 03/15/2016 0422   GFRAA 59 (L) 03/15/2016 0422   PT/INR No results for input(s): LABPROT, INR in the last 72 hours.  Studies/Results: No results found.  Assessment/Plan: We'll continue to increase her diet and activity level and tentatively plan for discharge tomorrow morning. This plans been discussed with her in detail. She is in agreement

## 2016-03-16 MED ORDER — HYDROCODONE-ACETAMINOPHEN 5-325 MG PO TABS: 1.0000 | ORAL_TABLET | Freq: Four times a day (QID) | ORAL | 0 refills | Status: AC | PRN

## 2016-03-16 NOTE — Plan of Care (Signed)
Problem: Pain Managment: Goal: General experience of comfort will improve Outcome: Adequate for Discharge Denies pain. Tolerating liquids.

## 2016-03-16 NOTE — Discharge Planning (Signed)
Patient ID: Elizabeth Rice MRN: [id number] DOB/AGE: [birth year] 60 y.o.  Admit date: 03/13/2016 Discharge date: 03/16/2016  Discharge Diagnoses:  Acute and chronic cholecystitis  Procedures Performed: Laparoscopic cholecystectomy  Discharged Condition: good  Hospital Course: She was admitted with a history of intermittent episodes consistent with biliary colic and an episode of persistent pain with nausea suggestive of acute cholecystitis. After appropriate preoperative preparation informed consent she was taken to surgery where she underwent laparoscopic cholecystectomy. Gallbladder had significant inflammatory change and a drain was left in place. She did well postoperatively with some mild incisional pain problems and nausea. Her symptoms improved over the last 24 hours. She is up active tolerating a diet with no complaints. She'll be discharged home today to be found the office in 7-10 days time. Bathing activity drive instructions were given the patient.  Discharge Orders: Discharge Instructions    Diet - low sodium heart healthy    Complete by:  As directed    Driving Restrictions    Complete by:  As directed    Do not drive. Taking pain medicine   Increase activity slowly    Complete by:  As directed    Lifting restrictions    Complete by:  As directed    Do not lift anything larger than your dinner plate      Disposition: 01-Home or Self Care  Discharge Medications:  Current Facility-Administered Medications:  .  albuterol (PROVENTIL) (2.5 MG/3ML) 0.083% nebulizer solution 3 mL, 3 mL, Inhalation, Q6H, Florene Glen, MD, 3 mL at 03/16/16 0718 .  alum & mag hydroxide-simeth (MAALOX/MYLANTA) 200-200-20 MG/5ML suspension 30 mL, 30 mL, Oral, Q4H PRN, Florene Glen, MD, 30 mL at 03/15/16 2038 .  Chlorhexidine Gluconate Cloth 2 % PADS 6 each, 6 each, Topical, Daily, Florene Glen, MD, 6 each at 03/14/16 1528 .  cholecalciferol (VITAMIN D) tablet 2,000 Units, 2,000 Units,  Oral, Daily, Florene Glen, MD, 2,000 Units at 03/15/16 1024 .  heparin injection 5,000 Units, 5,000 Units, Subcutaneous, Q8H, Florene Glen, MD, 5,000 Units at 03/16/16 0600 .  HYDROcodone-acetaminophen (NORCO/VICODIN) 5-325 MG per tablet 1-2 tablet, 1-2 tablet, Oral, Q6H PRN, Dia Crawford III, MD, 2 tablet at 03/15/16 2121 .  HYDROmorphone (DILAUDID) injection 1 mg, 1 mg, Intravenous, Q2H PRN, Dia Crawford III, MD, 1 mg at 03/15/16 1024 .  Influenza vac split quadrivalent PF (FLUARIX) injection 0.5 mL, 0.5 mL, Intramuscular, Tomorrow-1000, Florene Glen, MD .  levothyroxine (SYNTHROID, LEVOTHROID) tablet 50 mcg, 50 mcg, Oral, QAC breakfast, Florene Glen, MD, 50 mcg at 03/15/16 [phone] .  loratadine (CLARITIN) tablet 10 mg, 10 mg, Oral, Daily, Florene Glen, MD, 10 mg at 03/15/16 1024 .  mometasone-formoterol (DULERA) 200-5 MCG/ACT inhaler 2 puff, 2 puff, Inhalation, BID, Florene Glen, MD, 2 puff at 03/15/16 2039 .  mupirocin ointment (BACTROBAN) 2 % 1 application, 1 application, Nasal, BID, Florene Glen, MD, 1 application at [phone] .  ondansetron (ZOFRAN) tablet 4 mg, 4 mg, Oral, Q6H PRN **OR** ondansetron (ZOFRAN) injection 4 mg, 4 mg, Intravenous, Q6H PRN, Florene Glen, MD, 4 mg at 03/14/16 1245 .  pantoprazole (PROTONIX) EC tablet 40 mg, 40 mg, Oral, Daily, Florene Glen, MD, 40 mg at 03/15/16 1024 .  pneumococcal 23 valent vaccine (PNU-IMMUNE) injection 0.5 mL, 0.5 mL, Intramuscular, Tomorrow-1000, Florene Glen, MD .  tamoxifen (NOLVADEX) tablet 20 mg, 20 mg, Oral, Daily, Florene Glen, MD, 20 mg at 03/15/16 1024  Follwup:  Follow-up [facility] up in 1 week(s).   Specialty:  General Surgery Contact information: [address] [place] [phone]          Signed: Dia Crawford III 03/16/2016, 8:01 AM

## 2016-03-16 NOTE — Progress Notes (Signed)
Discharge instructions reviewed with patient and friend.  Understanding was verbalized and all questions were answered.  Patient discharged home via wheelchair in stable condition escorted by nursing staff.

## 2016-03-16 NOTE — Discharge Instructions (Signed)
Cholecystitis Cholecystitis is swelling and irritation (inflammation) of the gallbladder. The gallbladder is an organ that is shaped like a pear. It is under the liver on the right side of the body. This condition is often caused by gallstones. You doctor may do tests to see how your gallbladder works. These tests may include:  Imaging tests, such as:  An ultrasound.  MRI.  Tests that check how your liver works. This condition needs treatment. [place] care will depend on your treatment. In general:  Take over-the-counter and prescription medicines only as told by your doctor.  If you were prescribed an antibiotic medicine, take it as told by your doctor. Do not stop taking the antibiotic even if you start to feel better.  Follow instructions from your doctor about what to eat or drink. When you are allowed to eat, avoid eating or drinking anything that causes your symptoms to start.  Keep all follow-up visits as told by your doctor. This is important. GET HELP IF:  You have pain and your medicine does not help.  You have a fever. GET HELP RIGHT AWAY IF:  Your pain moves to:  Another part of your belly (abdomen).  Your back.  Your symptoms do not go away.  You have new symptoms.   This information is not intended to replace advice given to you by your health care provider. Make sure you discuss any questions you have with your health care provider.   Document Released: 04/19/2011 Document Revised: 01/19/2015 Document Reviewed: 08/11/2014 Elsevier Interactive Patient Education 2016 [employer] Laparoscopic Cholecystectomy, Care After   These instructions give you information on caring for yourself after your procedure. Your doctor may also give you more specific instructions. Call your doctor if you have any problems or questions after your procedure.  HOME CARE  Change your bandages (dressings) as told by your doctor.  Keep the wound dry and clean. Wash the  wound gently with soap and water. Pat the wound dry with a clean towel.  Do not take baths, swim, or use hot tubs for 2 weeks, or as told by your doctor.  Only take medicine as told by your doctor.  Eat a normal diet as told by your doctor.  Do not lift anything heavier than 10 pounds (4.5 kg) until your doctor says it is okay.  Do not play contact sports for 1 week, or as told by your doctor. GET HELP IF:  Your wound is red, puffy (swollen), or painful.  You have yellowish-white fluid (pus) coming from the wound.  You have fluid draining from the wound for more than 1 day.  You have a bad smell coming from the wound.  Your wound breaks open. GET HELP RIGHT AWAY IF:  You have trouble breathing.  You have chest pain.  You have a fever >101  You have pain in the shoulders (shoulder strap areas) that is getting worse.  You feel dizzy or pass out (faint).  You have severe belly (abdominal) pain.  You feel sick to your stomach (nauseous) or throw up (vomit) for more than 1 day.

## 2016-03-20 ENCOUNTER — Ambulatory Visit
Admission: RE | Admit: 2016-03-20 | Discharge: 2016-03-20 | Disposition: A | Discharge status: Home / Self Care | Payer: Self-pay | Source: Ambulatory Visit | Attending: *Deleted | Admitting: *Deleted

## 2016-03-20 ENCOUNTER — Other Ambulatory Visit: Payer: Self-pay | Admitting: *Deleted

## 2016-03-20 DIAGNOSIS — Z9289 Personal history of other medical treatment: Secondary | ICD-10-CM

## 2016-03-23 ENCOUNTER — Ambulatory Visit (INDEPENDENT_AMBULATORY_CARE_PROVIDER_SITE_OTHER): Payer: BLUE CROSS/BLUE SHIELD | Admitting: Surgery

## 2016-03-23 ENCOUNTER — Encounter: Payer: Self-pay | Admitting: Surgery

## 2016-03-23 VITALS — BP 121/78 | HR 79 | Temp 98.5°F | Wt 201.0 lb

## 2016-03-23 DIAGNOSIS — Z09 Encounter for follow-up examination after completed treatment for conditions other than malignant neoplasm: Secondary | ICD-10-CM

## 2016-03-23 NOTE — Patient Instructions (Signed)

## 2016-03-23 NOTE — Progress Notes (Signed)
S/p lap choel by Dr. Pat Patrick for cholecystitis, Path d/w pt, no CA Doing much better, taking PO, no fevers or chills  PE NAD Abd: soft, Mild appropriate incisional tenderness, no peritonitis. Stitches removed. No infection  A/P doing well No heavy lifting RTC prn

## 2016-03-31 NOTE — Discharge Summary (Signed)
Patient ID: Elizabeth Rice MRN: [id number] DOB/AGE: 1955/09/27 60 y.o.  Admit date: 03/13/2016 Discharge date: 03/31/2016  Discharge Diagnoses:  Acute and chronic cholecystitis with cholelithiasis.  Procedures Performed: Laparoscopic cholecystectomy  Discharged Condition: good  Hospital Course: She was admitted to the hospital urgently with signs and symptoms consistent with acute on chronic cholecystitis and cholelithiasis. Imaging laboratory values a clinical presentation confirmed the diagnosis. Surgery was recommended. She was taken to surgery for laparoscopic cholecystectomy. Intraoperatively she had no significant problems. Postoperatively she had some mild abdominal discomfort. A JP drain been placed during surgery which was removed prior to discharge. After appropriate discharge goals were met, she was discharged with bathing activity driving instructions with plans to follow-up in the office in 7-10 days time.  Discharge Orders: Discharge Instructions    Diet - low sodium heart healthy    Complete by:  As directed    Driving Restrictions    Complete by:  As directed    Do not drive. Taking pain medicine   Increase activity slowly    Complete by:  As directed    Lifting restrictions    Complete by:  As directed    Do not lift anything larger than your dinner plate      Disposition: 01-Home or Self Care  Discharge Medications: No current facility-administered medications for this encounter.   Current Outpatient Prescriptions:  .  ADVAIR DISKUS 250-50 MCG/DOSE AEPB, INHALE 1 PUFF INTO THE LUNGS 2 (TWO) TIMES DAILY., Disp: 180 each, Rfl: 6 .  cetirizine (ZYRTEC) 10 MG tablet, Take 10 mg by mouth daily as needed for allergies., Disp: , Rfl:  .  Cholecalciferol (VITAMIN D) 2000 UNITS CAPS, Take 2,000 Units by mouth daily., Disp: , Rfl:  .  levothyroxine (SYNTHROID, LEVOTHROID) 50 MCG tablet, TAKE 1 TABLET (50 MCG TOTAL) BY MOUTH DAILY., Disp: 90 tablet, Rfl: 3 .  naproxen  sodium (ANAPROX) 220 MG tablet, Take 220 mg by mouth as needed (pain). , Disp: , Rfl:  .  omeprazole (PRILOSEC) 20 MG capsule, Take 20 mg by mouth daily. Take two caps in the morning and one cap at night, Disp: , Rfl:  .  PROAIR HFA 108 (90 BASE) MCG/ACT inhaler, INHALE 2 PUFFS INTO THE LUNGS EVERY 6 (SIX) HOURS AS NEEDED., Disp: 8.5 Inhaler, Rfl: 2 .  tamoxifen (NOLVADEX) 20 MG tablet, Take 1 tablet (20 mg total) by mouth daily., Disp: 30 tablet, Rfl: 11 .  HYDROcodone-acetaminophen (NORCO/VICODIN) 5-325 MG tablet, Take 1-2 tablets by mouth every 6 (six) hours as needed for moderate pain., Disp: 30 tablet, Rfl: 0  Follwup: Follow-up Information    Jules Husbands, MD. Go on 03/23/2016.   Specialty:  General Surgery Why:  Friday at 9:45am for hospital follow-up Contact information: [place] [address] [phone]           Signed: Dia Crawford III 03/31/2016, 10:10 AM

## 2016-04-12 ENCOUNTER — Other Ambulatory Visit: Payer: Self-pay

## 2016-04-12 MED ORDER — LEVOTHYROXINE SODIUM 50 MCG PO TABS: ORAL_TABLET | ORAL | 3 refills | Status: DC

## 2016-04-12 NOTE — Telephone Encounter (Signed)
Patient requested a refill through the pharmacy, she has not had labs at our office in >then a year.  Last had a OV as an acute with Dr. Lacinda Axon, has not established care with another PCP yet.  Please advise?

## 2016-04-27 ENCOUNTER — Other Ambulatory Visit: Payer: Self-pay | Admitting: General Surgery

## 2016-04-30 ENCOUNTER — Ambulatory Visit
Admission: RE | Admit: 2016-04-30 | Discharge: 2016-04-30 | Disposition: A | Discharge status: Home / Self Care | Payer: BLUE CROSS/BLUE SHIELD | Source: Ambulatory Visit | Attending: General Surgery | Admitting: General Surgery

## 2016-04-30 ENCOUNTER — Encounter: Payer: Self-pay | Admitting: General Surgery

## 2016-04-30 ENCOUNTER — Ambulatory Visit (INDEPENDENT_AMBULATORY_CARE_PROVIDER_SITE_OTHER): Payer: BLUE CROSS/BLUE SHIELD | Admitting: General Surgery

## 2016-04-30 VITALS — BP 118/72 | HR 68 | Resp 12 | Ht 66.0 in | Wt 206.0 lb

## 2016-04-30 DIAGNOSIS — N6091 Unspecified benign mammary dysplasia of right breast: Secondary | ICD-10-CM

## 2016-04-30 HISTORY — DX: Malignant (primary) neoplasm, unspecified: C80.1

## 2016-04-30 NOTE — Patient Instructions (Signed)
Patient will be asked to return to the office in one year with a bilateral diagnotic  mammogram. 

## 2016-04-30 NOTE — Progress Notes (Signed)
Patient ID: Elizabeth Rice, female   DOB: Oct 08, 1955, 60 y.o.   MRN: [id number]  Chief Complaint  Patient presents with  . Follow-up    mammogram     HPI Elizabeth Rice is a 60 y.o. female  who presents for a breast evaluation. The most recent mammogram was done on 04/30/2016  Patient does perform regular self breast checks and gets regular mammograms done.    HPI  Past Medical History:  Diagnosis Date  . Asthma   . 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 Barrett esophagus    Dr Vira Agar  . Cancer ([place])    skin  . Chronic kidney disease   . DDD (degenerative disc disease)   . Deafness   . GERD (gastroesophageal reflux disease)   . Hearing loss   . Hearing loss 2007  . Hypothyroidism   . Thyroid disease   . Unspecified vitamin D deficiency     Past Surgical History:  Procedure Laterality Date  . BREAST BIOPSY Right 05/05/14  . BREAST BIOPSY Right 11/04/2014   Procedure: BREAST BIOPSY;  Surgeon: Robert Bellow, MD;  Location: [facility] ORS;  Service: General;  Laterality: Right;  . CERVICAL FUSION    . [place], 2002  . CHOLECYSTECTOMY N/A 03/14/2016   Procedure: LAPAROSCOPIC CHOLECYSTECTOMY;  Surgeon: Dia Crawford III, MD;  Location: [facility] ORS;  Service: General;  Laterality: N/A;  . TONSILLECTOMY  1986    Family History  Problem Relation Age of Onset  . Cancer Mother 64    breast  . Breast cancer Mother 42  . Cancer Maternal Aunt 70    breast  . Breast cancer Maternal Aunt 79  . Migraines Daughter     Social History Social History  Substance Use Topics  . Smoking status: Former Smoker    Quit date: 05/14/1986  . Smokeless tobacco: Never Used  . Alcohol use 0.0 oz/week     Comment: occasionally    Allergies  Allergen Reactions  . Ivp Dye [Iodinated Diagnostic Agents] Anaphylaxis  . Broccoli [Brassica Oleracea Italica] Diarrhea    Current Outpatient Prescriptions  Medication Sig  Dispense Refill  . ADVAIR DISKUS 250-50 MCG/DOSE AEPB INHALE 1 PUFF INTO THE LUNGS 2 (TWO) TIMES DAILY. 180 each 6  . aspirin EC 81 MG tablet Take 81 mg by mouth daily.    . cetirizine (ZYRTEC) 10 MG tablet Take 10 mg by mouth daily as needed for allergies.    . Cholecalciferol (VITAMIN D) 2000 UNITS CAPS Take 2,000 Units by mouth daily.    Marland Kitchen HYDROcodone-acetaminophen (NORCO/VICODIN) 5-325 MG tablet Take 1-2 tablets by mouth every 6 (six) hours as needed for moderate pain. 30 tablet 0  . levothyroxine (SYNTHROID, LEVOTHROID) 50 MCG tablet TAKE 1 TABLET (50 MCG TOTAL) BY MOUTH DAILY. 90 tablet 3  . naproxen sodium (ANAPROX) 220 MG tablet Take 220 mg by mouth as needed (pain).     Marland Kitchen omeprazole (PRILOSEC) 20 MG capsule Take 20 mg by mouth daily. Take two caps in the morning and one cap at night    . PROAIR HFA 108 (90 BASE) MCG/ACT inhaler INHALE 2 PUFFS INTO THE LUNGS EVERY 6 (SIX) HOURS AS NEEDED. 8.5 Inhaler 2  . tamoxifen (NOLVADEX) 20 MG tablet TAKE 1 TABLET (20 MG TOTAL) BY MOUTH DAILY. 30 tablet 11   No current facility-administered medications for this visit.     Review of Systems  Review of Systems  Constitutional: Negative.   Respiratory: Negative.   Cardiovascular: Negative.     Blood pressure 118/72, pulse 68, resp. rate 12, height 5\' 6"  (1.676 m), weight 206 lb (93.4 kg).  Physical Exam Physical Exam  Constitutional: She is oriented to person, place, and time. She appears well-developed and well-nourished.  Eyes: Conjunctivae are normal. No scleral icterus.  Neck: Neck supple.  Cardiovascular: Normal rate, regular rhythm and normal heart sounds.   Pulmonary/Chest: Effort normal and breath sounds normal. Right breast exhibits no inverted nipple, no mass, no nipple discharge, no skin change and no tenderness. Left breast exhibits no inverted nipple, no mass, no nipple discharge, no skin change and no tenderness.    Abdominal: Soft. Bowel sounds are normal. There is no  tenderness.  Lymphadenopathy:    She has no cervical adenopathy.  Neurological: She is alert and oriented to person, place, and time.  Skin: Skin is warm.    Data Reviewed Bilateral mammograms completed earlier today were reviewed. No interval change, BIRAD-2.  Assessment    Atypical ductal hyperplasia, tamoxifen well tolerated.  Good recovery status post cholecystectomy for acute cholecystitis 6 weeks ago.    Plan        Patient will be asked to return to the office in one year with a bilateral diagnotic mammogram.  This information has been scribed by Gaspar Cola CMA.  Robert Bellow 04/30/2016, 9:09 PM

## 2016-05-03 ENCOUNTER — Other Ambulatory Visit: Payer: BLUE CROSS/BLUE SHIELD

## 2016-05-03 ENCOUNTER — Ambulatory Visit: Payer: BLUE CROSS/BLUE SHIELD

## 2016-06-01 DIAGNOSIS — K859 Acute pancreatitis without necrosis or infection, unspecified: Secondary | ICD-10-CM | POA: Insufficient documentation

## 2017-03-05 ENCOUNTER — Other Ambulatory Visit: Payer: Self-pay

## 2017-03-05 DIAGNOSIS — N6091 Unspecified benign mammary dysplasia of right breast: Secondary | ICD-10-CM

## 2017-03-30 ENCOUNTER — Other Ambulatory Visit: Payer: Self-pay | Admitting: Family Medicine

## 2017-04-01 NOTE — Telephone Encounter (Signed)
DR. Caryl Bis out of Office and patient is transferring out of office to new PCP, no TSH in the last 2 years, requesting refill on Levothyroxine?

## 2017-04-01 NOTE — Telephone Encounter (Signed)
Refill for 30 days only.   

## 2017-04-26 ENCOUNTER — Other Ambulatory Visit: Payer: Self-pay | Admitting: General Surgery

## 2017-05-08 ENCOUNTER — Ambulatory Visit
Admission: RE | Admit: 2017-05-08 | Discharge: 2017-05-08 | Disposition: A | Discharge status: Home / Self Care | Payer: BLUE CROSS/BLUE SHIELD | Source: Ambulatory Visit | Attending: General Surgery | Admitting: General Surgery

## 2017-05-08 ENCOUNTER — Other Ambulatory Visit: Payer: Self-pay | Admitting: Internal Medicine

## 2017-05-08 ENCOUNTER — Ambulatory Visit: Payer: BLUE CROSS/BLUE SHIELD | Admitting: General Surgery

## 2017-05-08 VITALS — BP 138/78 | HR 90 | Resp 12 | Ht 66.0 in | Wt 196.0 lb

## 2017-05-08 DIAGNOSIS — N6091 Unspecified benign mammary dysplasia of right breast: Secondary | ICD-10-CM | POA: Diagnosis not present

## 2017-05-08 DIAGNOSIS — K8500 Idiopathic acute pancreatitis without necrosis or infection: Secondary | ICD-10-CM

## 2017-05-08 NOTE — Patient Instructions (Addendum)
The patient is aware to call back for any questions or concerns.  The patient has been asked to return to the office in one year with a bilateral screening mammogram

## 2017-05-08 NOTE — Progress Notes (Signed)
Patient ID: Elizabeth Rice, female   DOB: [birth year], 61 y.o.   MRN: [id number]  Chief Complaint  Patient presents with  . Follow-up    HPI Elizabeth Rice is a 61 y.o. female.  who presents for follow up ADH right breast and a breast evaluation. The most recent mammogram was done on 05-08-17.  Patient does perform regular self breast checks and gets regular mammograms done.  No new breast issues. Tolerating tamoxifen.  She was in the hospital in January for pancreatitis, but since she has been doing well.  HPI  Past Medical History:  Diagnosis Date  . Asthma   . 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 Barrett esophagus    Dr Vira Agar  . Cancer ([place])    skin  . Chronic kidney disease   . DDD (degenerative disc disease)   . Deafness   . GERD (gastroesophageal reflux disease)   . Hearing loss   . Hearing loss 2007  . Hypothyroidism   . Thyroid disease   . Unspecified vitamin D deficiency     Past Surgical History:  Procedure Laterality Date  . BREAST BIOPSY Right 05/05/14   neg  . BREAST BIOPSY Right 11/04/2014   Procedure: BREAST BIOPSY;  Surgeon: Robert Bellow, MD;  Location: [facility] ORS;  Service: General;  Laterality: Right;  . BREAST EXCISIONAL BIOPSY Right 11/04/2014   neg  . CERVICAL FUSION    . [place], 2002  . CHOLECYSTECTOMY N/A 03/14/2016   Procedure: LAPAROSCOPIC CHOLECYSTECTOMY;  Surgeon: Dia Crawford III, MD;  Location: [facility] ORS;  Service: General;  Laterality: N/A;  . TONSILLECTOMY  1986    Family History  Problem Relation Age of Onset  . Cancer Mother 66       breast  . Breast cancer Mother 69  . Cancer Maternal Aunt 70       breast  . Breast cancer Maternal Aunt 79  . Migraines Daughter     Social History Social History   Tobacco Use  . Smoking status: Former Smoker    Last attempt to quit: 05/14/1986    Years since quitting: 31.0  . Smokeless tobacco: Never Used   Substance Use Topics  . Alcohol use: Yes    Alcohol/week: 0.0 oz    Comment: occasionally  . Drug use: No    Allergies  Allergen Reactions  . Ivp Dye [Iodinated Diagnostic Agents] Anaphylaxis  . Broccoli [Brassica Oleracea Italica] Diarrhea    Current Outpatient Medications  Medication Sig Dispense Refill  . ADVAIR DISKUS 250-50 MCG/DOSE AEPB INHALE 1 PUFF INTO THE LUNGS 2 (TWO) TIMES DAILY. 180 each 6  . aspirin EC 81 MG tablet Take 81 mg by mouth daily.    Marland Kitchen b complex vitamins capsule Take 1 capsule by mouth daily.    . cetirizine (ZYRTEC) 10 MG tablet Take 10 mg by mouth daily as needed for allergies.    . Cholecalciferol (VITAMIN D) 2000 UNITS CAPS Take 2,000 Units by mouth daily.    Marland Kitchen HYDROcodone-acetaminophen (NORCO/VICODIN) 5-325 MG tablet Take 1-2 tablets by mouth every 6 (six) hours as needed for moderate pain. 30 tablet 0  . levothyroxine (SYNTHROID, LEVOTHROID) 50 MCG tablet TAKE 1 TABLET (50 MCG TOTAL) BY MOUTH DAILY. 30 tablet 0  . naproxen sodium (ANAPROX) 220 MG tablet Take 220 mg by mouth as needed (pain).     Marland Kitchen omeprazole (PRILOSEC) 20 MG capsule  Take 20 mg by mouth daily. Take two caps in the morning and one cap at night    . PROAIR HFA 108 (90 BASE) MCG/ACT inhaler INHALE 2 PUFFS INTO THE LUNGS EVERY 6 (SIX) HOURS AS NEEDED. 8.5 Inhaler 2  . tamoxifen (NOLVADEX) 20 MG tablet TAKE 1 TABLET (20 MG TOTAL) BY MOUTH DAILY. 30 tablet 3   No current facility-administered medications for this visit.     Review of Systems Review of Systems  Constitutional: Negative.   Respiratory: Negative.   Cardiovascular: Negative.     Blood pressure 138/78, pulse 90, resp. rate 12, height 5\' 6"  (1.676 m), weight 196 lb (88.9 kg), SpO2 98 %.  Physical Exam Physical Exam  Constitutional: She is oriented to person, place, and time. She appears well-developed and well-nourished.  HENT:  Mouth/Throat: Oropharynx is clear and moist.  Eyes: Conjunctivae are normal. No scleral  icterus.  Neck: Neck supple.  Cardiovascular: Normal rate, regular rhythm and normal heart sounds.  Pulmonary/Chest: Effort normal and breath sounds normal. Right breast exhibits no inverted nipple, no mass, no nipple discharge, no skin change and no tenderness. Left breast exhibits no inverted nipple, no mass, no nipple discharge, no skin change and no tenderness.    Well healed incision right breast  Lymphadenopathy:    She has no cervical adenopathy.    She has no axillary adenopathy.  Neurological: She is alert and oriented to person, place, and time.  Skin: Skin is warm and dry.  Psychiatric: Her behavior is normal.    Data Reviewed March 14, 2016 operative report from her cholecystectomy showed an acutely inflamed gallbladder.  Cholangiograms were not completed.  January 2018 [facility] records reviewed  May 08, 2017 bilateral mammogram is reviewed.  Postsurgical changes.  Annual screening studies recommended.  BI-RADS-2.  Assessment    Stable breast exam.    Plan    Review of the literature does not suggest a contraindication to continued tamoxifen therapy.     The patient has been asked to return to the office in one year with a bilateral screening mammogram.   HPI, Physical Exam, Assessment and Plan have been scribed under the direction and in the presence of Robert Bellow, MD. Karie Fetch, RN  I have completed the exam and reviewed the above documentation for accuracy and completeness.  I agree with the above.  Haematologist has been used and any errors in dictation or transcription are unintentional.  Hervey Ard, M.D., F.A.C.S.  Robert Bellow 05/09/2017, 8:38 PM

## 2017-09-03 ENCOUNTER — Other Ambulatory Visit: Payer: Self-pay | Admitting: General Surgery

## 2018-03-03 ENCOUNTER — Other Ambulatory Visit: Payer: Self-pay

## 2018-03-03 DIAGNOSIS — N6091 Unspecified benign mammary dysplasia of right breast: Secondary | ICD-10-CM

## 2018-04-25 NOTE — Progress Notes (Signed)
Patients insurance paper work has been completed copied for scan and mailed back to her to sign.

## 2018-05-15 ENCOUNTER — Telehealth: Payer: Self-pay | Admitting: General Surgery

## 2018-05-15 NOTE — Telephone Encounter (Signed)
Left message that it would be end of Jan first of February before we would know and that she could discuss with her PCP. She will call back to let us know what she wants [place] to do.

## 2018-05-15 NOTE — Telephone Encounter (Signed)
Patient is calling said she would like someone to call her, her insurance is changing and Dr. Bary Castilla is not on her list of doctors and is asking if Dr. Bary Castilla knows a doctor from [facility] that he recommends. Please call patient and advise.

## 2018-05-20 ENCOUNTER — Ambulatory Visit: Payer: BLUE CROSS/BLUE SHIELD | Admitting: General Surgery

## 2018-10-01 IMAGING — MG MM DIGITAL DIAGNOSTIC BILAT W/ TOMO W/ CAD
8 of 12 series · 8 of 28 positions shown · non-contrast
Comparison: Previous exam(s).

CLINICAL DATA: Patient presents for bilateral diagnostic
examination due to history of previous right excisional biopsy 8973
demonstrating flat epithelial atypia.

EXAM:
2D DIGITAL DIAGNOSTIC BILATERAL MAMMOGRAM WITH CAD AND ADJUNCT TOMO

[L MLO]
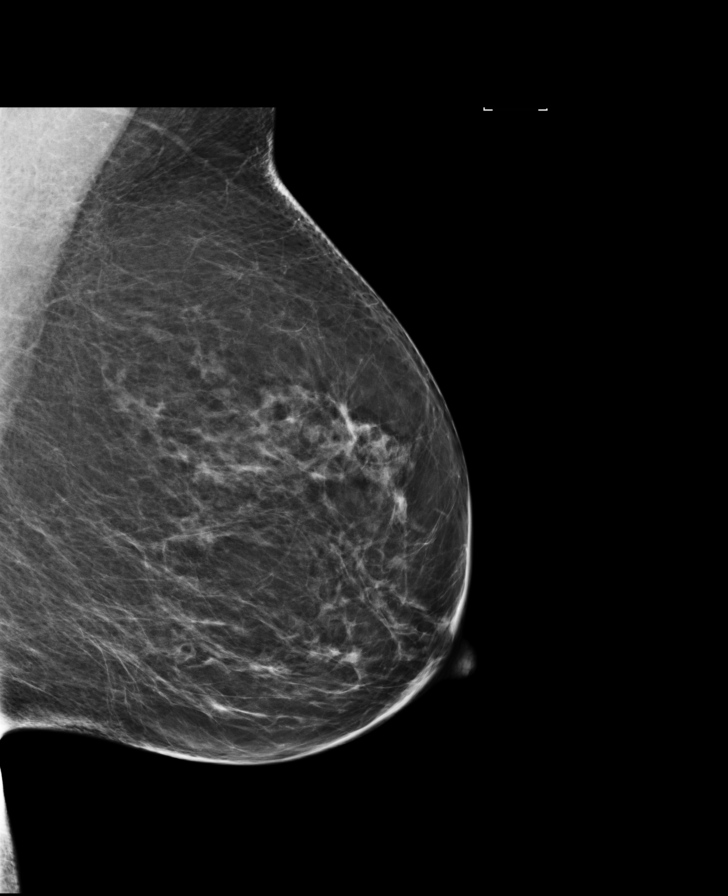

[L CC]
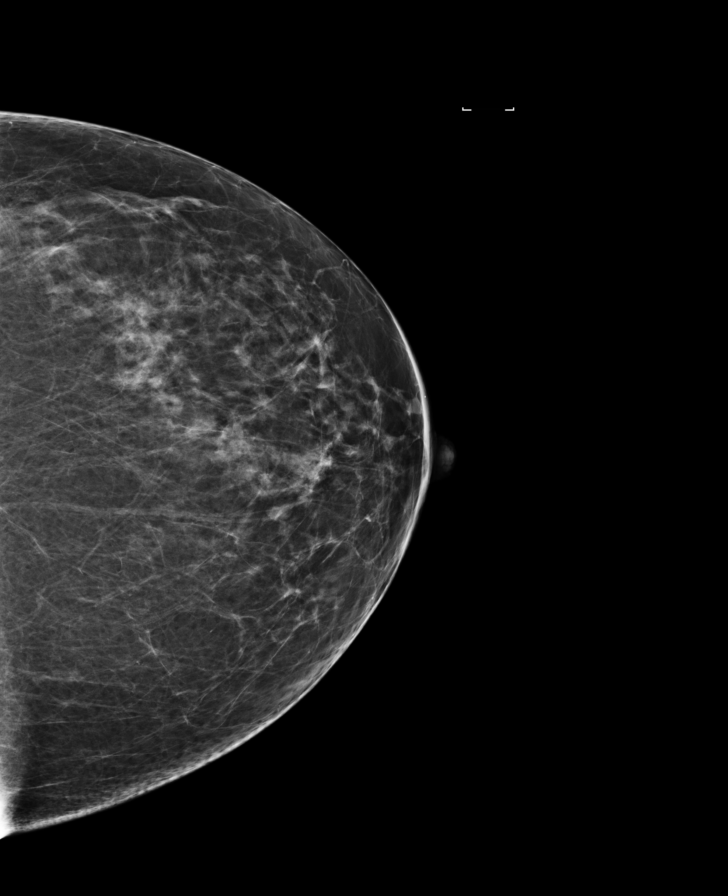

[L CC synth-2D]
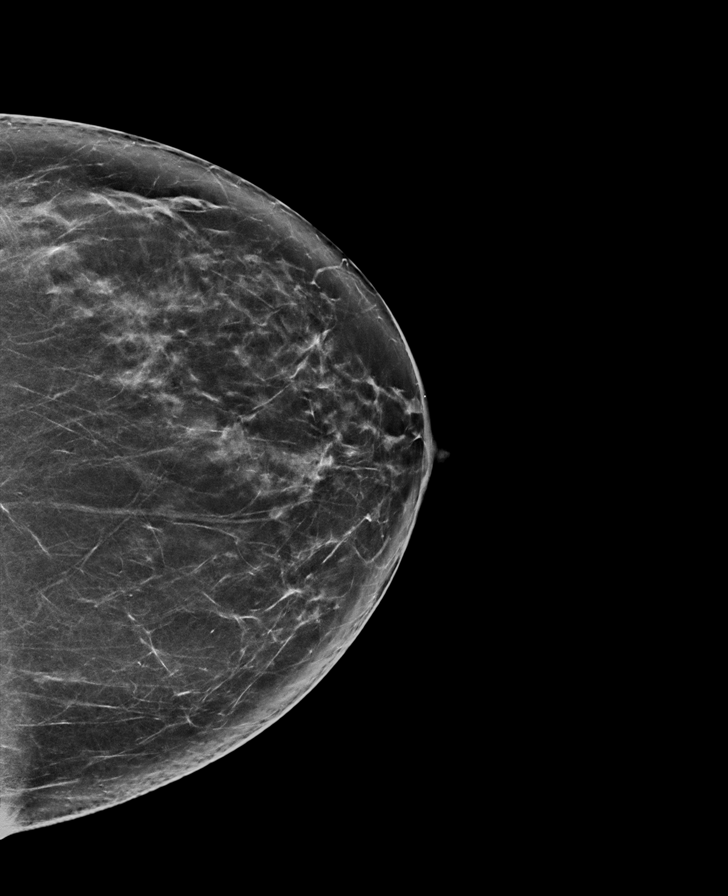

[R MLO synth-2D]
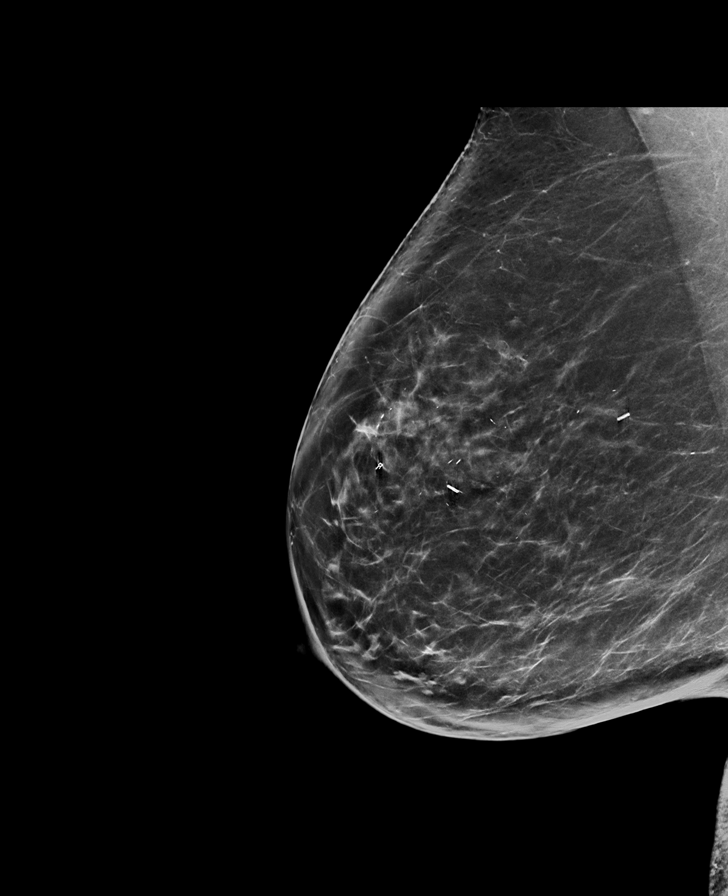

[R CC synth-2D]
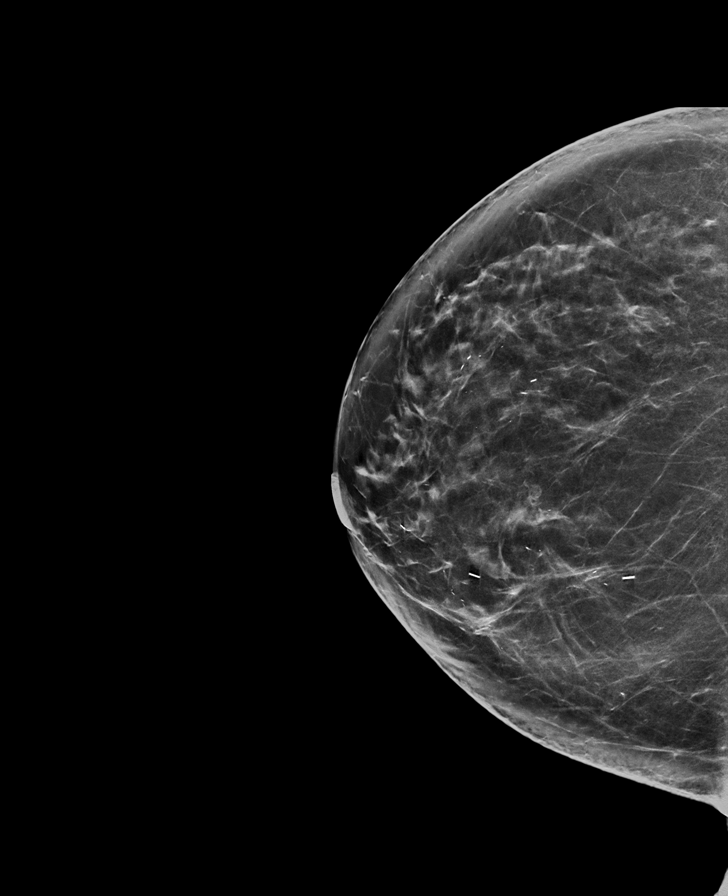

[R MLO]
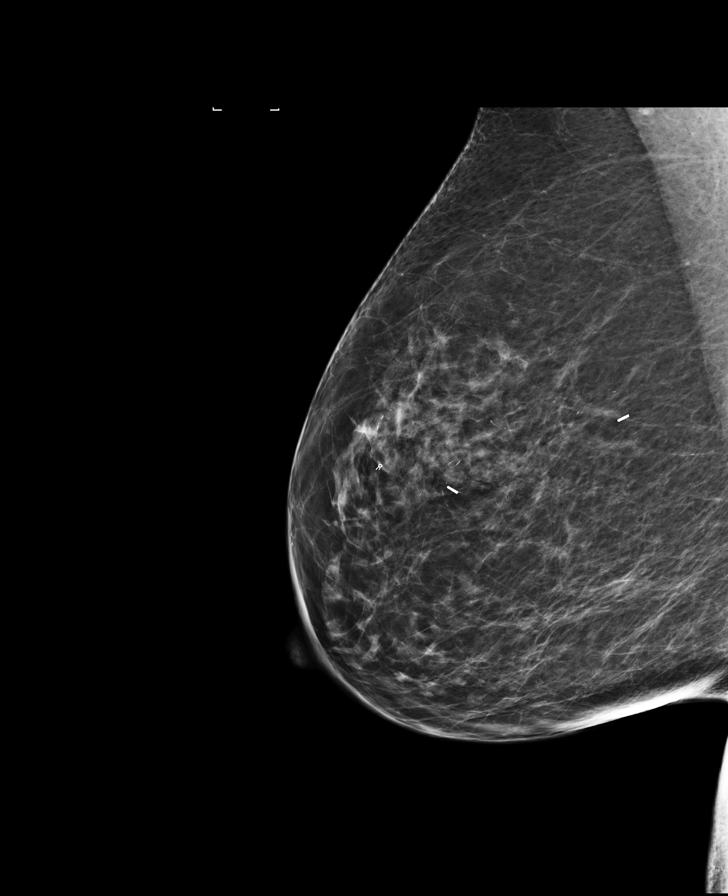

[R CC]
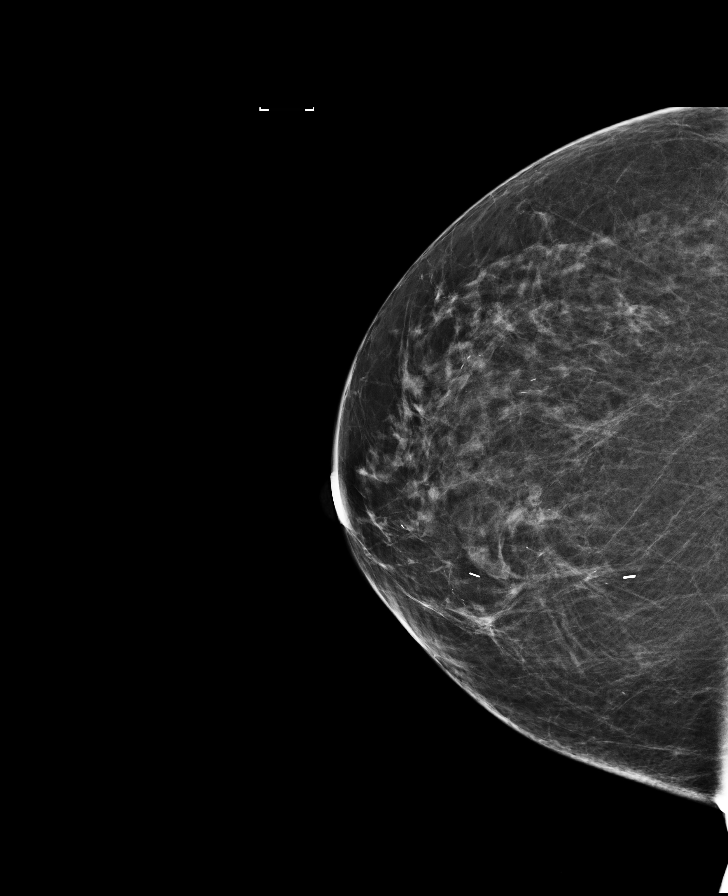

[L MLO synth-2D]
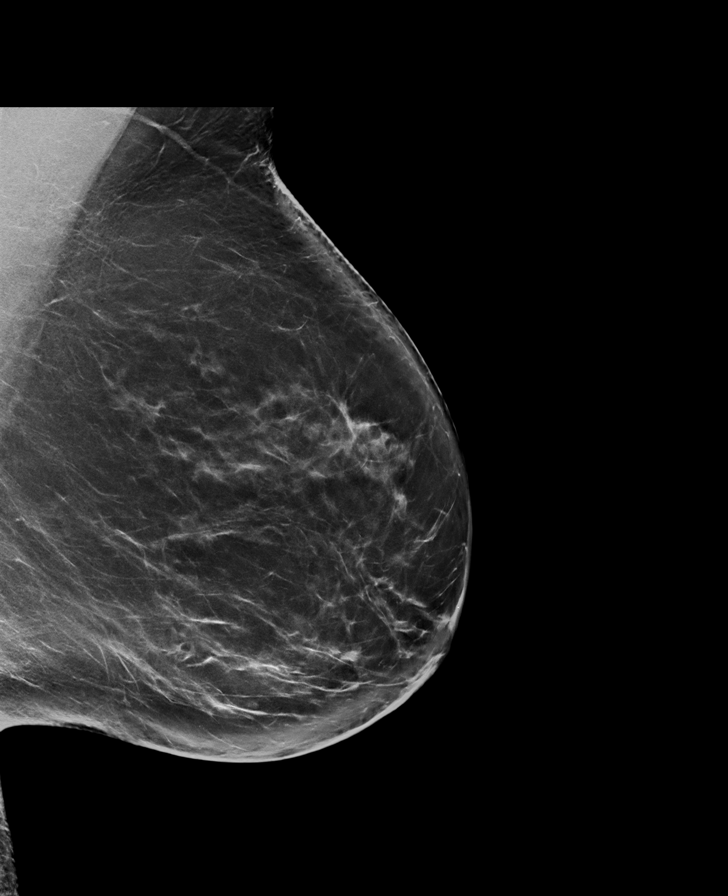

[8 of 28 positions shown; findings below may reference images not displayed]

ACR Breast Density Category b: There are scattered areas of
fibroglandular density.
FINDINGS: Examination demonstrates stable post surgical changes over the inner
upper right breast. Remainder of the exam is unchanged.

Mammographic images were processed with CAD.
IMPRESSION: Stable postsurgical changes right breast.

RECOMMENDATION:
Recommend continued annual bilateral screening mammographic
followup.

I have discussed the findings and recommendations with the patient.
Results were also provided in writing at the conclusion of the
visit. If applicable, a reminder letter will be sent to the patient
regarding the next appointment.

BI-RADS CATEGORY  2: Benign.

## 2018-12-02 ENCOUNTER — Encounter: Payer: Self-pay | Admitting: General Surgery
